# Patient Record
Sex: Female | Born: 1990
Health system: Southern US, Community
[De-identification: ages and names within clinical notes are randomized; demographics above are authoritative.]

## PROBLEM LIST (undated history)

## (undated) ENCOUNTER — Inpatient Hospital Stay (HOSPITAL_COMMUNITY): Payer: Self-pay

## (undated) ENCOUNTER — Emergency Department (HOSPITAL_BASED_OUTPATIENT_CLINIC_OR_DEPARTMENT_OTHER): Admission: EM | Payer: BC Managed Care – PPO | Source: Home / Self Care

## (undated) DIAGNOSIS — D649 Anemia, unspecified: Secondary | ICD-10-CM

## (undated) DIAGNOSIS — R87610 Atypical squamous cells of undetermined significance on cytologic smear of cervix (ASC-US): Secondary | ICD-10-CM

## (undated) DIAGNOSIS — O009 Unspecified ectopic pregnancy without intrauterine pregnancy: Secondary | ICD-10-CM

## (undated) DIAGNOSIS — F99 Mental disorder, not otherwise specified: Secondary | ICD-10-CM

## (undated) DIAGNOSIS — R87629 Unspecified abnormal cytological findings in specimens from vagina: Secondary | ICD-10-CM

## (undated) DIAGNOSIS — N200 Calculus of kidney: Secondary | ICD-10-CM

## (undated) HISTORY — PX: KIDNEY STONE SURGERY: SHX686

## (undated) HISTORY — DX: Mental disorder, not otherwise specified: F99

## (undated) HISTORY — DX: Unspecified abnormal cytological findings in specimens from vagina: R87.629

## (undated) HISTORY — DX: Unspecified ectopic pregnancy without intrauterine pregnancy: O00.90

## (undated) HISTORY — DX: Anemia, unspecified: D64.9

## (undated) HISTORY — DX: Atypical squamous cells of undetermined significance on cytologic smear of cervix (ASC-US): R87.610

---

## 2017-06-19 ENCOUNTER — Other Ambulatory Visit: Payer: Self-pay

## 2017-06-19 ENCOUNTER — Emergency Department (HOSPITAL_BASED_OUTPATIENT_CLINIC_OR_DEPARTMENT_OTHER)
Admission: EM | Admit: 2017-06-19 | Discharge: 2017-06-19 | Disposition: A | Discharge status: Home / Self Care | Payer: 59 | Attending: Emergency Medicine | Admitting: Emergency Medicine

## 2017-06-19 ENCOUNTER — Encounter (HOSPITAL_BASED_OUTPATIENT_CLINIC_OR_DEPARTMENT_OTHER): Payer: Self-pay

## 2017-06-19 DIAGNOSIS — R202 Paresthesia of skin: Secondary | ICD-10-CM | POA: Diagnosis not present

## 2017-06-19 DIAGNOSIS — F419 Anxiety disorder, unspecified: Secondary | ICD-10-CM | POA: Diagnosis not present

## 2017-06-19 HISTORY — DX: Calculus of kidney: N20.0

## 2017-06-19 LAB — BASIC METABOLIC PANEL
Anion gap: 10 (ref 5–15)
BUN: 13 mg/dL (ref 6–20)
CALCIUM: 8.9 mg/dL (ref 8.9–10.3)
CO2: 21 mmol/L — ABNORMAL LOW (ref 22–32)
CREATININE: 0.79 mg/dL (ref 0.44–1.00)
Chloride: 104 mmol/L (ref 101–111)
Glucose, Bld: 121 mg/dL — ABNORMAL HIGH (ref 65–99)
Potassium: 3.7 mmol/L (ref 3.5–5.1)
SODIUM: 135 mmol/L (ref 135–145)

## 2017-06-19 LAB — CBC WITH DIFFERENTIAL/PLATELET
BASOS ABS: 0 10*3/uL (ref 0.0–0.1)
BASOS PCT: 0 %
EOS ABS: 0.1 10*3/uL (ref 0.0–0.7)
EOS PCT: 1 %
HCT: 34.2 % — ABNORMAL LOW (ref 36.0–46.0)
HEMOGLOBIN: 11.2 g/dL — AB (ref 12.0–15.0)
Lymphocytes Relative: 18 %
Lymphs Abs: 2.1 10*3/uL (ref 0.7–4.0)
MCH: 23.1 pg — ABNORMAL LOW (ref 26.0–34.0)
MCHC: 32.7 g/dL (ref 30.0–36.0)
MCV: 70.5 fL — ABNORMAL LOW (ref 78.0–100.0)
Monocytes Absolute: 0.9 10*3/uL (ref 0.1–1.0)
Monocytes Relative: 8 %
NEUTROS PCT: 73 %
Neutro Abs: 8.5 10*3/uL — ABNORMAL HIGH (ref 1.7–7.7)
PLATELETS: 455 10*3/uL — AB (ref 150–400)
RBC: 4.85 MIL/uL (ref 3.87–5.11)
RDW: 18.8 % — ABNORMAL HIGH (ref 11.5–15.5)
WBC: 11.7 10*3/uL — AB (ref 4.0–10.5)

## 2017-06-19 MED ORDER — LORAZEPAM 1 MG PO TABS: 0.5000 mg | ORAL_TABLET | Freq: Once | ORAL | Status: DC

## 2017-06-19 MED ORDER — LORAZEPAM 2 MG/ML IJ SOLN
0.5000 mg | Freq: Once | INTRAMUSCULAR | Status: DC
  Administered 2017-06-19: 0.5 mg via INTRAVENOUS
  Filled 2017-06-19: qty 1

## 2017-06-19 NOTE — ED Notes (Signed)
ED Provider at bedside. 

## 2017-06-19 NOTE — ED Notes (Signed)
Pt verbalizes understanding of d/c instructions and denies any further needs at this time. 

## 2017-06-19 NOTE — ED Triage Notes (Signed)
C/o numbness to left hand started yesterday-denies injury-denies numbness beyond hand-NAD-steady gait

## 2017-06-20 NOTE — ED Provider Notes (Signed)
[facility] EMERGENCY DEPARTMENT Provider Note   CSN: [id number] Arrival date & time: 06/19/17  1819     History   Chief Complaint Chief Complaint  Patient presents with  . Numbness    HPI Joy Duke is a 27 y.o. female.  HPI Patient presents with paresthesias in her left hand.  States it feels numb like when she slept on it.  States she woke up with it this morning.  Thought she had slept on it wrong but that continued to feel numb.  States it does not tingle but feels numb.  States she has had no problems using it.  Think she has anxiety about it.  States she is very nervous about what this could be.  No headache.  No confusion.  No other numbness or weakness.  It is over the whole hand.  Patient tends to get anxious about health problems. Past Medical History:  Diagnosis Date  . Kidney stone     There are no active problems to display for this patient.   Past Surgical History:  Procedure Laterality Date  . KIDNEY STONE SURGERY       OB History   None      Home Medications    Prior to Admission medications   Not on File    Family History No family history on file.  Social History Social History   Tobacco Use  . Smoking status: Never Smoker  . Smokeless tobacco: Never Used  Substance Use Topics  . Alcohol use: Yes    Comment: occ  . Drug use: Never     Allergies   Patient has no known allergies.   Review of Systems Review of Systems  Constitutional: Negative for appetite change.  HENT: Negative for dental problem.   Respiratory: Negative for shortness of breath.   Cardiovascular: Negative for chest pain.  Gastrointestinal: Negative for abdominal pain.  Endocrine: Negative for polyuria.  Genitourinary: Negative for vaginal pain.  Musculoskeletal: Negative for back pain.  Skin: Negative for rash.  Neurological: Positive for numbness. Negative for weakness.  Psychiatric/Behavioral: The patient is nervous/anxious.       Physical Exam Updated Vital Signs BP 140/77   Pulse (!) 116   Temp 99.1 F (37.3 C)   Resp 13   Ht 5\' 2"  (1.575 m)   Wt 90.7 kg (200 lb)   LMP 05/23/2017   SpO2 99%   BMI 36.58 kg/m   Physical Exam  Constitutional: She appears well-developed.  HENT:  Head: Normocephalic.  Eyes: EOM are normal.  Cardiovascular:  Tachycardia  Abdominal: Soft.  Musculoskeletal:  Strong radial pulse.  Neurological: She is alert.  Sensation grossly intact over left hand.  Good sensation radial median ulnar distribution.  Good strength of her radial median and ulnar distribution.  Good flexion extension at the wrist elbow and shoulder.  Skin: Skin is warm.  Psychiatric: She has a normal mood and affect.     ED Treatments / Results  Labs (all labs ordered are listed, but only abnormal results are displayed) Labs Reviewed  BASIC METABOLIC PANEL - Abnormal; Notable for the following components:      Result Value   CO2 21 (*)    Glucose, Bld 121 (*)    All other components within normal limits  CBC WITH DIFFERENTIAL/PLATELET - Abnormal; Notable for the following components:   WBC 11.7 (*)    Hemoglobin 11.2 (*)    HCT 34.2 (*)    MCV 70.5 (*)  MCH 23.1 (*)    RDW 18.8 (*)    Platelets 455 (*)    Neutro Abs 8.5 (*)    All other components within normal limits    EKG EKG Interpretation  Date/Time:  Tuesday Jun 19 2017 18:33:33 EDT Ventricular Rate:  131 PR Interval:  130 QRS Duration: 76 QT Interval:  306 QTC Calculation: 451 R Axis:   89 Text Interpretation:  Sinus tachycardia Nonspecific T wave abnormality Abnormal ECG Confirmed by Davonna Belling [phone]) on 06/19/2017 7:58:07 PM   Radiology No results found.  Procedures Procedures (including critical care time)  Medications Ordered in ED Medications  LORazepam (ATIVAN) tablet 0.5 mg (0.5 mg Oral Not Given 06/19/17 2101)     Initial Impression / Assessment and Plan / ED Course  I have reviewed the triage  vital signs and the nursing notes.  Pertinent labs & imaging results that were available during my care of the patient were reviewed by me and considered in my medical decision making (see chart for details).     Patient with left hand paresthesias.  Benign exam.  Also tachycardia.  This is improved with rest and patient feels as if the hand is gotten better.  The tachycardia improves when she is less anxious.  Final Clinical Impressions(s) / ED Diagnoses   Final diagnoses:  Paresthesias  Anxiety    ED Discharge Orders    None       Davonna Belling, MD 06/20/17 [phone]

## 2017-06-26 ENCOUNTER — Emergency Department (HOSPITAL_BASED_OUTPATIENT_CLINIC_OR_DEPARTMENT_OTHER)
Admission: EM | Admit: 2017-06-26 | Discharge: 2017-06-27 | Disposition: A | Discharge status: Home / Self Care | Payer: 59 | Attending: Emergency Medicine | Admitting: Emergency Medicine

## 2017-06-26 ENCOUNTER — Encounter (HOSPITAL_BASED_OUTPATIENT_CLINIC_OR_DEPARTMENT_OTHER): Payer: Self-pay | Admitting: *Deleted

## 2017-06-26 ENCOUNTER — Other Ambulatory Visit: Payer: Self-pay

## 2017-06-26 DIAGNOSIS — M940 Chondrocostal junction syndrome [Tietze]: Secondary | ICD-10-CM

## 2017-06-26 DIAGNOSIS — M5412 Radiculopathy, cervical region: Secondary | ICD-10-CM | POA: Diagnosis not present

## 2017-06-26 DIAGNOSIS — M542 Cervicalgia: Secondary | ICD-10-CM | POA: Diagnosis present

## 2017-06-26 MED ORDER — METHYLPREDNISOLONE 4 MG PO TBPK: ORAL_TABLET | ORAL | 0 refills | Status: DC

## 2017-06-26 NOTE — ED Notes (Signed)
Pt. Reports having neck pain in May same as she is having now and taking Prednisone dose Pk for this with relief.

## 2017-06-26 NOTE — ED Triage Notes (Signed)
Pain on the left side of her neck for a month. She was seen and diagnosed with muscle spasms. Pain under her left breast for 3 days. Pressing on a certain spot makes it worse.

## 2017-06-26 NOTE — ED Provider Notes (Signed)
[facility] Provider Note: Joy Spurling, MD, FACEP  CSN: [id number] MRN: [id number] ARRIVAL: 06/26/17 at 2125 ROOM: MHFT2/MHFT2   CHIEF COMPLAINT  Neck Pain   HISTORY OF PRESENT ILLNESS  06/26/17 11:33 PM Joy Duke is a 27 y.o. female with about a 109-month history of intermittent pain in her neck.  The pain is in the left side of her neck.  She sometimes has pain radiating to her left elbow as well as paresthesias of the left hand and about the C8 dermatome.  She was seen for paresthesias late last month and was given reassurance.  She had been seen earlier last month at an urgent care for her neck pain and got relief with a steroid taper.  Her symptoms have subsequently returned.  The pain is not severe but is making her anxious.  Pain is worse with movement of the neck.  She is also having some focal rib pain below her left breast.  She states she recently started working out at the gym.  Past Medical History:  Diagnosis Date  . Kidney stone     Past Surgical History:  Procedure Laterality Date  . KIDNEY STONE SURGERY      No family history on file.  Social History   Tobacco Use  . Smoking status: Never Smoker  . Smokeless tobacco: Never Used  Substance Use Topics  . Alcohol use: Yes    Comment: occ  . Drug use: Never    Prior to Admission medications   Not on File    Allergies Patient has no known allergies.   REVIEW OF SYSTEMS  Negative except as noted here or in the History of Present Illness.   PHYSICAL EXAMINATION  Initial Vital Signs Blood pressure (!) 158/87, pulse (!) 105, temperature 98.3 F (36.8 C), temperature source Oral, resp. rate 16, height 5\' 2"  (1.575 m), weight 90.7 kg (200 lb), last menstrual period 06/19/2017, SpO2 100 %.  Examination General: Well-developed, well-nourished female in no acute distress; appearance consistent with age of record HENT: normocephalic; atraumatic Eyes: pupils equal, round and reactive to  light; extraocular muscles intact Neck: supple; mild tenderness left posterolateral neck; movement of the neck reproduces pain Heart: regular rate and rhythm Lungs: clear to auscultation bilaterally Chest: Well localized left paraspinal rib tenderness at about the left inframammary fold Abdomen: soft; nondistended Extremities: No deformity; full range of motion Neurologic: Awake, alert and oriented; motor function intact in all extremities and symmetric; no facial droop Skin: Warm and dry Psychiatric: Normal mood and affect   RESULTS  Summary of this visit's results, reviewed by myself:   EKG Interpretation  Date/Time:    Ventricular Rate:    PR Interval:    QRS Duration:   QT Interval:    QTC Calculation:   R Axis:     Text Interpretation:        Laboratory Studies: No results found for this or any previous visit (from the past 24 hour(s)). Imaging Studies: No results found.  ED COURSE and MDM  Nursing notes and initial vitals signs, including pulse oximetry, reviewed.  Vitals:   06/26/17 2130 06/26/17 2131  BP:  (!) 158/87  Pulse:  (!) 105  Resp:  16  Temp:  98.3 F (36.8 C)  TempSrc:  Oral  SpO2:  100%  Weight: 90.7 kg (200 lb)   Height: 5\' 2"  (1.575 m)    Neck pain and paresthesias consistent with a C8 cervical radiculopathy.  Chest wall pain consistent  with acute costochondritis.  Since she had significant relief with a steroid taper she is requesting the same and we will provide this.  We will refer her to sports medicine for further evaluation and treatment.  PROCEDURES    ED DIAGNOSES     ICD-10-CM   1. Cervical radiculopathy at C8 M54.12   2. Costochondritis M94.0        Colbe Viviano, MD 06/26/17 2348

## 2017-08-03 ENCOUNTER — Encounter: Payer: Self-pay | Admitting: Osteopathic Medicine

## 2017-08-03 ENCOUNTER — Ambulatory Visit (INDEPENDENT_AMBULATORY_CARE_PROVIDER_SITE_OTHER): Payer: 59 | Admitting: Osteopathic Medicine

## 2017-08-03 VITALS — BP 128/72 | HR 86 | Temp 98.7°F | Ht 63.5 in | Wt 196.0 lb

## 2017-08-03 DIAGNOSIS — R21 Rash and other nonspecific skin eruption: Secondary | ICD-10-CM | POA: Diagnosis not present

## 2017-08-03 DIAGNOSIS — R002 Palpitations: Secondary | ICD-10-CM

## 2017-08-03 DIAGNOSIS — F419 Anxiety disorder, unspecified: Secondary | ICD-10-CM

## 2017-08-03 DIAGNOSIS — R7309 Other abnormal glucose: Secondary | ICD-10-CM | POA: Insufficient documentation

## 2017-08-03 DIAGNOSIS — R7303 Prediabetes: Secondary | ICD-10-CM | POA: Insufficient documentation

## 2017-08-03 DIAGNOSIS — D509 Iron deficiency anemia, unspecified: Secondary | ICD-10-CM

## 2017-08-03 NOTE — Patient Instructions (Addendum)
Plan:  Palpitations/anxiety  Will get Holter monitor took at heart rhythm for 48 hours  Will get echocardiogram to evaluate heart function/structure  Will get labs today   Consider medications +/- cardiology referral based on results   Anemia  Will recheck labs  Likely cause is iron deficiency or thalassemia   Rash   On breast: I think it's just a small infection of hair follicle, something these can be severe or form boils - come see me if that happens  On leg: can try treating with over-the-counter antifungal (clotrimazole cream/ointment) and see if this helps - mild itchy fungal infections can be difficult to detect on dark skin

## 2017-08-03 NOTE — Progress Notes (Signed)
HPI: Joy Duke is a 27 y.o. female who  has a past medical history of Kidney stone.  she presents to [facility] today, 08/03/17,  for chief complaint of: New to Establish Depression/Anxiety   Palpitations  Palpitations few times per day, comes w/ anxiety but not always every time feeling anxious, notes fast HR without SOB/dizziness. ER visit, EKG showed sinus tach. BMP and CBC ok.   Seeing a therapist for anxiety, which is helping. Would like to avoid medications but had a few questions about medicines.   Rash on breast: on L, no lump, was draning a bit but now healed, would like it looked at.   Other skin: Itching spot on R thigh on occasion, no rash.    Depression screen PHQ 2/9 08/03/2017  Decreased Interest 2  Down, Depressed, Hopeless 1  PHQ - 2 Score 3  Altered sleeping 2  Tired, decreased energy 1  Change in appetite 1  Feeling bad or failure about yourself  1  Trouble concentrating 2  Moving slowly or fidgety/restless 0  Suicidal thoughts 0  PHQ-9 Score 10  Difficult doing work/chores Somewhat difficult   GAD 7 : Generalized Anxiety Score 08/03/2017  Nervous, Anxious, on Edge 2  Control/stop worrying 2  Worry too much - different things 2  Trouble relaxing 1  Restless 1  Easily annoyed or irritable 1  Afraid - awful might happen 1  Total GAD 7 Score 10  Anxiety Difficulty Somewhat difficult      Past medical, surgical, social and family history reviewed:  Patient Active Problem List   Diagnosis Date Noted  . Palpitations 08/03/2017  . Rash and nonspecific skin eruption 08/03/2017  . Microcytic anemia 08/03/2017  . Anxiety 08/03/2017  . Elevated glucose level 08/03/2017   Past Surgical History:  Procedure Laterality Date  . KIDNEY STONE SURGERY     Social History   Tobacco Use  . Smoking status: Never Smoker  . Smokeless tobacco: Never Used  Substance Use Topics  . Alcohol use: Yes    Comment: occ    Family History  Problem Relation Age of Onset  . Diabetes Mother   . Hypertension Mother   . Breast cancer Maternal Grandmother      Current medication list and allergy/intolerance information reviewed:    No current outpatient medications on file.   No current facility-administered medications for this visit.     No Known Allergies    Review of Systems:  Constitutional:  No  fever, no chills, No recent illness, No unintentional weight changes. No significant fatigue.   HEENT: No  headache, no vision change, no hearing change, No sore throat, No  sinus pressure  Cardiac: No  chest pain, No  pressure, +palpitations, No  Orthopnea  Respiratory:  No  shortness of breath. No  Cough  Gastrointestinal: No  abdominal pain, No  nausea, No  vomiting,  No  blood in stool, No  diarrhea, No  constipation   Musculoskeletal: No new myalgia/arthralgia  Skin: +Rash, No other wounds/concerning lesions  Genitourinary: No  incontinence, No  abnormal genital bleeding, No abnormal genital discharge  Hem/Onc: No  easy bruising/bleeding, No  abnormal lymph node  Endocrine: No cold intolerance,  No heat intolerance. No polyuria/polydipsia/polyphagia   Neurologic: No  weakness, No  dizziness, No  slurred speech/focal weakness/facial droop  Psychiatric: +concerns with depression, +concerns with anxiety, No sleep problems, No mood problems  Exam:  BP 128/72   Pulse 86  Temp 98.7 F (37.1 C) (Oral)   Ht 5' 3.5" (1.613 m)   Wt 196 lb (88.9 kg)   BMI 34.18 kg/m   Constitutional: VS see above. General Appearance: alert, well-developed, well-nourished, NAD  Eyes: Normal lids and conjunctive, non-icteric sclera  Ears, Nose, Mouth, Throat: MMM, Normal external inspection ears/nares/mouth/lips/gums. TM normal bilaterally. Pharynx/tonsils no erythema, no exudate. Nasal mucosa normal.   Neck: No masses, trachea midline. No thyroid enlargement. No tenderness/mass appreciated. No  lymphadenopathy  Respiratory: Normal respiratory effort. no wheeze, no rhonchi, no rales  Cardiovascular: S1/S2 normal, no murmur, no rub/gallop auscultated. RRR. No lower extremity edema. Pedal pulse II/IV bilaterally DP and PT. No carotid bruit or JVD. No abdominal aortic bruit.  Gastrointestinal: Nontender, no masses. No hepatomegaly, no splenomegaly. No hernia appreciated. Bowel sounds normal. Rectal exam deferred.   Musculoskeletal: Gait normal. No clubbing/cyanosis of digits.   Neurological: Normal balance/coordination. No tremor. No cranial nerve deficit on limited exam. Motor and sensation intact and symmetric. Cerebellar reflexes intact.   Skin: warm, dry, intact. No rash/ulcer. No concerning nevi or subq nodules on limited exam.    Psychiatric: Normal judgment/insight. Normal mood and affect. Oriented x3.   BREAST: No skin changes, healing looks likely old folliculitis or mild abscess on L breast, R breast normal skin, size limits full exam but palpable feels all normal fibrous breast tissue, no masses or tenderness, normal nipple without discharge, normal axilla       ASSESSMENT/PLAN:    Palpitations - Plan: CBC, COMPLETE METABOLIC PANEL WITH GFR, Lipid panel, TSH, Urinalysis, Routine w reflex microscopic, Holter monitor - 48 hour, ECHOCARDIOGRAM COMPLETE  Rash and nonspecific skin eruption  Microcytic anemia - Plan: Fe+TIBC+Fer, Hemoglobinopathy evaluation  Anxiety - discussed med options, we opted to defer for now and will consider based on cardiac workup and other test resutls, symptoms, d/w therapist   Elevated glucose level - Plan: Hemoglobin A1c    Patient Instructions  Plan:  Palpitations/anxiety  Will get Holter monitor took at heart rhythm for 48 hours  Will get echocardiogram to evaluate heart function/structure  Will get labs today   Consider medications +/- cardiology referral based on results   Anemia  Will recheck labs  Likely cause is  iron deficiency or thalassemia   Rash   On breast: I think it's just a small infection of hair follicle, something these can be severe or form boils - come see me if that happens  On leg: can try treating with over-the-counter antifungal (clotrimazole cream/ointment) and see if this helps - mild itchy fungal infections can be difficult to detect on dark skin      Visit summary with medication list and pertinent instructions was printed for patient to review. All questions at time of visit were answered - patient instructed to contact office with any additional concerns. ER/RTC precautions were reviewed with the patient.   Follow-up plan: Return for 2-3 weeks, recheck palpitations/anxiety, see [place] sooner if needed .    Please note: voice recognition software was used to produce this document, and typos may escape review. Please contact Dr. Sheppard Coil for any needed clarifications.

## 2017-08-04 LAB — IRON,TIBC AND FERRITIN PANEL
%SAT: 7 % (calc) — ABNORMAL LOW (ref 16–45)
Ferritin: 10 ng/mL — ABNORMAL LOW (ref 16–154)
Iron: 28 ug/dL — ABNORMAL LOW (ref 40–190)
TIBC: 403 mcg/dL (calc) (ref 250–450)

## 2017-08-04 LAB — COMPLETE METABOLIC PANEL WITH GFR
AG Ratio: 1.2 (calc) (ref 1.0–2.5)
ALBUMIN MSPROF: 4.3 g/dL (ref 3.6–5.1)
ALKALINE PHOSPHATASE (APISO): 90 U/L (ref 33–115)
ALT: 12 U/L (ref 6–29)
AST: 13 U/L (ref 10–30)
BILIRUBIN TOTAL: 0.5 mg/dL (ref 0.2–1.2)
BUN: 9 mg/dL (ref 7–25)
CHLORIDE: 103 mmol/L (ref 98–110)
CO2: 23 mmol/L (ref 20–32)
CREATININE: 0.72 mg/dL (ref 0.50–1.10)
Calcium: 9.3 mg/dL (ref 8.6–10.2)
GFR, Est African American: 134 mL/min/{1.73_m2} (ref >=60)
GFR, Est Non African American: 116 mL/min/{1.73_m2} (ref >=60)
GLOBULIN: 3.7 g/dL (ref 1.9–3.7)
Glucose, Bld: 101 mg/dL — ABNORMAL HIGH (ref 65–99)
POTASSIUM: 4.6 mmol/L (ref 3.5–5.3)
Sodium: 136 mmol/L (ref 135–146)
Total Protein: 8 g/dL (ref 6.1–8.1)

## 2017-08-04 LAB — URINALYSIS, ROUTINE W REFLEX MICROSCOPIC
BILIRUBIN URINE: NEGATIVE
Bacteria, UA: NONE SEEN /HPF
Glucose, UA: NEGATIVE
Hgb urine dipstick: NEGATIVE
Hyaline Cast: NONE SEEN /LPF
Ketones, ur: NEGATIVE
Leukocytes, UA: NEGATIVE
NITRITE: NEGATIVE
PROTEIN: NEGATIVE
RBC / HPF: NONE SEEN /HPF (ref 0–2)
Specific Gravity, Urine: 1.028 (ref 1.001–1.03)
WBC, UA: NONE SEEN /HPF (ref 0–5)
pH: 6.5 (ref 5.0–8.0)

## 2017-08-04 LAB — LIPID PANEL
CHOL/HDL RATIO: 3.2 (calc) (ref <5.0)
CHOLESTEROL: 159 mg/dL (ref <200)
HDL: 49 mg/dL — ABNORMAL LOW (ref >50)
LDL Cholesterol (Calc): 92 mg/dL (calc)
Non-HDL Cholesterol (Calc): 110 mg/dL (calc) (ref <130)
Triglycerides: 89 mg/dL (ref <150)

## 2017-08-04 LAB — CBC
HEMATOCRIT: 35.6 % (ref 35.0–45.0)
HEMOGLOBIN: 10.7 g/dL — AB (ref 11.7–15.5)
MCH: 21.6 pg — AB (ref 27.0–33.0)
MCHC: 30.1 g/dL — AB (ref 32.0–36.0)
MCV: 71.8 fL — ABNORMAL LOW (ref 80.0–100.0)
MPV: 9.8 fL (ref 7.5–12.5)
Platelets: 504 10*3/uL — ABNORMAL HIGH (ref 140–400)
RBC: 4.96 10*6/uL (ref 3.80–5.10)
RDW: 16.7 % — ABNORMAL HIGH (ref 11.0–15.0)
WBC: 10.5 10*3/uL (ref 3.8–10.8)

## 2017-08-04 LAB — HEMOGLOBIN A1C
Hgb A1c MFr Bld: 6.1 % of total Hgb — ABNORMAL HIGH (ref <5.7)
Mean Plasma Glucose: 128 (calc)
eAG (mmol/L): 7.1 (calc)

## 2017-08-04 LAB — TSH: TSH: 0.51 mIU/L

## 2017-08-08 MED ORDER — FERROUS SULFATE 325 (65 FE) MG PO TBEC: 325.0000 mg | DELAYED_RELEASE_TABLET | Freq: Every day | ORAL | 1 refills | Status: DC

## 2017-08-08 NOTE — Addendum Note (Signed)
Addended by: Maryla Morrow on: 08/08/2017 05:37 PM   Modules accepted: Orders

## 2017-08-22 ENCOUNTER — Other Ambulatory Visit (HOSPITAL_BASED_OUTPATIENT_CLINIC_OR_DEPARTMENT_OTHER): Payer: 59

## 2017-08-24 ENCOUNTER — Ambulatory Visit (HOSPITAL_BASED_OUTPATIENT_CLINIC_OR_DEPARTMENT_OTHER)
Admission: RE | Admit: 2017-08-24 | Discharge: 2017-08-24 | Disposition: A | Discharge status: Home / Self Care | Payer: 59 | Source: Ambulatory Visit | Attending: Osteopathic Medicine | Admitting: Osteopathic Medicine

## 2017-08-24 DIAGNOSIS — R002 Palpitations: Secondary | ICD-10-CM

## 2017-08-24 NOTE — Progress Notes (Signed)
  Echocardiogram 2D Echocardiogram has been performed.  Daric Koren T Wojciech Willetts RDCS, RVT 08/24/2017, 8:50 AM

## 2017-08-27 ENCOUNTER — Ambulatory Visit: Payer: 59

## 2017-08-27 DIAGNOSIS — R002 Palpitations: Secondary | ICD-10-CM

## 2017-09-11 NOTE — Progress Notes (Signed)
Holter monitor shows normal heart rhythm Average heart rate is on the high end of normal Option to discuss w/PCP treating palpitations with low-dose beta blocker

## 2017-11-08 ENCOUNTER — Other Ambulatory Visit (HOSPITAL_COMMUNITY)
Admission: RE | Admit: 2017-11-08 | Discharge: 2017-11-08 | Disposition: A | Discharge status: Home / Self Care | Payer: 59 | Source: Ambulatory Visit | Attending: Osteopathic Medicine | Admitting: Osteopathic Medicine

## 2017-11-08 ENCOUNTER — Ambulatory Visit (INDEPENDENT_AMBULATORY_CARE_PROVIDER_SITE_OTHER): Payer: 59 | Admitting: Osteopathic Medicine

## 2017-11-08 ENCOUNTER — Encounter: Payer: Self-pay | Admitting: Osteopathic Medicine

## 2017-11-08 VITALS — BP 138/92 | HR 108 | Temp 98.8°F | Wt 201.5 lb

## 2017-11-08 DIAGNOSIS — R7309 Other abnormal glucose: Secondary | ICD-10-CM

## 2017-11-08 DIAGNOSIS — Z01419 Encounter for gynecological examination (general) (routine) without abnormal findings: Secondary | ICD-10-CM | POA: Diagnosis present

## 2017-11-08 DIAGNOSIS — Z124 Encounter for screening for malignant neoplasm of cervix: Secondary | ICD-10-CM | POA: Diagnosis not present

## 2017-11-08 DIAGNOSIS — D509 Iron deficiency anemia, unspecified: Secondary | ICD-10-CM

## 2017-11-08 DIAGNOSIS — N939 Abnormal uterine and vaginal bleeding, unspecified: Secondary | ICD-10-CM

## 2017-11-08 DIAGNOSIS — N63 Unspecified lump in unspecified breast: Secondary | ICD-10-CM

## 2017-11-08 NOTE — Progress Notes (Signed)
HPI: Joy Duke is a 27 y.o. female who  has a past medical history of Kidney stone.  she presents to [facility] today, 11/08/17,  for chief complaint of: Well-Woman Exam w/ Pap  Patient here for annual physical / wellness exam.  See preventive care reviewed as below.   Additional concerns today include:  Need to follow-up on A1C, prediabetes range 3 mos ago at 6.1%    Past medical, surgical, social and family history reviewed:  Patient Active Problem List   Diagnosis Date Noted  . Palpitations 08/03/2017  . Rash and nonspecific skin eruption 08/03/2017  . Microcytic anemia 08/03/2017  . Anxiety 08/03/2017  . Elevated glucose level 08/03/2017    Past Surgical History:  Procedure Laterality Date  . KIDNEY STONE SURGERY      Social History   Tobacco Use  . Smoking status: Never Smoker  . Smokeless tobacco: Never Used  Substance Use Topics  . Alcohol use: Yes    Comment: occ    Family History  Problem Relation Age of Onset  . Diabetes Mother   . Hypertension Mother   . Breast cancer Maternal Grandmother      Current medication list and allergy/intolerance information reviewed:    Current Outpatient Medications  Medication Sig Dispense Refill  . ferrous sulfate 325 (65 FE) MG EC tablet Take 1 tablet (325 mg total) by mouth daily with breakfast. 90 tablet 1   No current facility-administered medications for this visit.     No Known Allergies    Review of Systems:  Constitutional:  No  fever, no chills, No recent illness, No unintentional weight changes. No significant fatigue.   HEENT: No  headache, no vision change  Cardiac: No  chest pain, No  pressure, No palpitations (have gotten much less frequent), No  Orthopnea  Respiratory:  No  shortness of breath. No  Cough  Gastrointestinal: No  abdominal pain, No  nausea  Musculoskeletal: No new myalgia/arthralgia  Skin: No  Rash, No other wounds/concerning  lesions  Genitourinary: No  incontinence, No  abnormal genital bleeding, No abnormal genital discharge  Hem/Onc: No  easy bruising/bleeding, No  abnormal lymph node  Endocrine: No cold intolerance,  No heat intolerance.  Neurologic: No  weakness, No  dizziness,  Psychiatric: No  concerns with depression, No  concerns with anxiety, No sleep problems, No mood problems  Exam:  BP (!) 148/80 (BP Location: Left Arm, Patient Position: Sitting, Cuff Size: Normal)   Pulse (!) 125   Temp 98.8 F (37.1 C) (Oral)   Wt 201 lb 8 oz (91.4 kg)   BMI 35.13 kg/m   Constitutional: VS see above. General Appearance: alert, well-developed, well-nourished, NAD  Eyes: Normal lids and conjunctive, non-icteric sclera  Ears, Nose, Mouth, Throat: MMM, Normal external inspection ears/nares/mouth/lips/gums. TM normal bilaterally. Pharynx/tonsils no erythema, no exudate. Nasal mucosa normal.   Neck: No masses, trachea midline. No thyroid enlargement. No tenderness/mass appreciated. No lymphadenopathy  Respiratory: Normal respiratory effort. no wheeze, no rhonchi, no rales  Cardiovascular: S1/S2 normal, no murmur, no rub/gallop auscultated. RRR. No lower extremity edema.   Gastrointestinal: Nontender, no masses. No hepatomegaly, no splenomegaly. No hernia appreciated. Bowel sounds normal. Rectal exam deferred.   Musculoskeletal: Gait normal. No clubbing/cyanosis of digits.   Neurological: Normal balance/coordination. No tremor. No cranial nerve deficit on limited exam. Motor and sensation intact and symmetric. Cerebellar reflexes intact.   Skin: warm, dry, intact. No rash/ulcer. No concerning nevi or subq  nodules on limited exam.    Psychiatric: Normal judgment/insight. Normal mood and affect. Oriented x3.  GYN: No lesions/ulcers to external genitalia, normal urethra, normal vaginal mucosa, physiologic discharge, cervix normal without lesions, uterus not enlarged or tender, adnexa no masses and  nontender  BREAST: No rashes/skin changes, normal fibrous breast tissue, no  Tenderness, small firm cystic mass R breast 3-4:00 normal nipple without discharge, normal axilla          ASSESSMENT/PLAN:   Well woman exam - Plan: Cytology - PAP, CBC, Hemoglobin A1c, Fe+TIBC+Fer  Cervical cancer screening - Plan: Cytology - PAP, CBC, Hemoglobin A1c, Fe+TIBC+Fer  Microcytic anemia - Plan: CBC, Hemoglobin A1c, Fe+TIBC+Fer  Elevated glucose level - Plan: CBC, Hemoglobin A1c, Fe+TIBC+Fer  Breast lump in female - Plan: US BREAST LTD UNI RIGHT INC AXILLA  Abnormal uterine bleeding - Plan: US PELVIS (TRANSABDOMINAL ONLY), US Transvaginal Non-OB    Patient Instructions  General Preventive Care  Most recent routine screening lipids/other labs: ordered today to follow-up. Cholesterol and Diabetes screening usually recommended annually.   Everyone should have blood pressure checked once per year.   Tobacco: don't! Alcohol: responsible moderation is ok for most adults - if you have concerns about your alcohol intake, please talk to me! Recreational/Illicit Drugs: don't!  Exercise: as tolerated to reduce risk of cardiovascular disease and diabetes. Strength training will also prevent osteoporosis.   Mental health: if need for mental health care (medicines, counseling, other), or concerns about moods, please let me know!   Sexual health: if need for STD testing, or if concerns with libido/pain problems, please let me know!  Vaccines  Flu vaccine: recommended for almost everyone, every fall (by Halloween! Flu is scary!), especially if you are pregnant, if you have exposure to the public, if you're around young children or the elderly, or if you're around pregnant people.   HPV vaccine: Gardasil protetcs against HPV infection which is the most common cause of cervical cancer. I recommend this vaccine for all kids and young adults.   Tetanus booster: Tdap recommended every 10 years Cancer  screenings   Colon cancer screening: recommended for everyone at age 19, but some folks need a colonoscopy sooner if risk factors   Breast cancer screening: mammogram recommended at age 33 every other year, but some folks need screening sooner depending on family history   Cervical cancer screening: Pap smear every 1 to 5 years depending on age and other risk factors. Can stop at age 32 or w/ hysterectomy as long as previous testing was normal.  Infection screenings . HIV: recommended screening at least once age 62-65, repeating as needed . Gonorrhea/Chlamydia: screening as needed, though many insurances require testing for anyone on birth control pills . TB: certain at-risk populations, or depending on work requirements and/or travel history             Visit summary with medication list and pertinent instructions was printed for patient to review. All questions at time of visit were answered - patient instructed to contact office with any additional concerns. ER/RTC precautions were reviewed with the patient.   Follow-up plan: Return for recheck depending on labs /results .    Please note: voice recognition software was used to produce this document, and typos may escape review. Please contact Dr. Sheppard Coil for any needed clarifications.

## 2017-11-08 NOTE — Patient Instructions (Addendum)
General Preventive Care  Most recent routine screening lipids/other labs: ordered today to follow-up. Cholesterol and Diabetes screening usually recommended annually.   Everyone should have blood pressure checked once per year.   Tobacco: don't! Alcohol: responsible moderation is ok for most adults - if you have concerns about your alcohol intake, please talk to me! Recreational/Illicit Drugs: don't!  Exercise: as tolerated to reduce risk of cardiovascular disease and diabetes. Strength training will also prevent osteoporosis.   Mental health: if need for mental health care (medicines, counseling, other), or concerns about moods, please let me know!   Sexual health: if need for STD testing, or if concerns with libido/pain problems, please let me know!  Vaccines  Flu vaccine: recommended for almost everyone, every fall (by Halloween! Flu is scary!), especially if you are pregnant, if you have exposure to the public, if you're around young children or the elderly, or if you're around pregnant people.   HPV vaccine: Gardasil protetcs against HPV infection which is the most common cause of cervical cancer. I recommend this vaccine for all kids and young adults.   Tetanus booster: Tdap recommended every 10 years Cancer screenings   Colon cancer screening: recommended for everyone at age 24, but some folks need a colonoscopy sooner if risk factors   Breast cancer screening: mammogram recommended at age 34 every other year, but some folks need screening sooner depending on family history   Cervical cancer screening: Pap smear every 1 to 5 years depending on age and other risk factors. Can stop at age 82 or w/ hysterectomy as long as previous testing was normal.  Infection screenings . HIV: recommended screening at least once age 23-65, repeating as needed . Gonorrhea/Chlamydia: screening as needed, though many insurances require testing for anyone on birth control pills . TB: certain at-risk  populations, or depending on work requirements and/or travel history

## 2017-11-09 LAB — CBC
HCT: 35.6 % (ref 35.0–45.0)
Hemoglobin: 11.4 g/dL — ABNORMAL LOW (ref 11.7–15.5)
MCH: 23.7 pg — ABNORMAL LOW (ref 27.0–33.0)
MCHC: 32 g/dL (ref 32.0–36.0)
MCV: 73.9 fL — AB (ref 80.0–100.0)
MPV: 10.3 fL (ref 7.5–12.5)
PLATELETS: 453 10*3/uL — AB (ref 140–400)
RBC: 4.82 10*6/uL (ref 3.80–5.10)
RDW: 17.2 % — ABNORMAL HIGH (ref 11.0–15.0)
WBC: 10.2 10*3/uL (ref 3.8–10.8)

## 2017-11-09 LAB — HEMOGLOBIN A1C
EAG (MMOL/L): 7 (calc)
Hgb A1c MFr Bld: 6 % of total Hgb — ABNORMAL HIGH (ref <5.7)
MEAN PLASMA GLUCOSE: 126 (calc)

## 2017-11-09 LAB — IRON,TIBC AND FERRITIN PANEL
%SAT: 7 % — AB (ref 16–45)
Ferritin: 20 ng/mL (ref 16–154)
IRON: 25 ug/dL — AB (ref 40–190)
TIBC: 369 mcg/dL (calc) (ref 250–450)

## 2017-11-14 ENCOUNTER — Encounter: Payer: Self-pay | Admitting: Osteopathic Medicine

## 2017-11-14 DIAGNOSIS — R87610 Atypical squamous cells of undetermined significance on cytologic smear of cervix (ASC-US): Secondary | ICD-10-CM | POA: Insufficient documentation

## 2017-11-14 HISTORY — DX: Atypical squamous cells of undetermined significance on cytologic smear of cervix (ASC-US): R87.610

## 2017-11-14 LAB — CYTOLOGY - PAP
DIAGNOSIS: UNDETERMINED — AB
HPV: NOT DETECTED

## 2017-11-21 ENCOUNTER — Other Ambulatory Visit: Payer: 59

## 2017-11-21 ENCOUNTER — Ambulatory Visit
Admission: RE | Admit: 2017-11-21 | Discharge: 2017-11-21 | Disposition: A | Discharge status: Home / Self Care | Payer: 59 | Source: Ambulatory Visit | Attending: Osteopathic Medicine | Admitting: Osteopathic Medicine

## 2017-11-21 DIAGNOSIS — N63 Unspecified lump in unspecified breast: Secondary | ICD-10-CM

## 2018-09-12 ENCOUNTER — Telehealth: Payer: Self-pay | Admitting: Osteopathic Medicine

## 2018-09-12 NOTE — Telephone Encounter (Signed)
Received paperwork from short-term disability for this patient, I have not seen her since October 2019 and have no idea what this disability form is about.  Can we schedule a virtual visit to discuss, or if another physician has been treating what ever she is claiming disability for, they are responsible for this paperwork.

## 2018-09-13 NOTE — Telephone Encounter (Signed)
Patient scheduled.

## 2018-09-17 ENCOUNTER — Ambulatory Visit: Payer: 59 | Admitting: Osteopathic Medicine

## 2018-11-05 ENCOUNTER — Encounter: Payer: Self-pay | Admitting: Osteopathic Medicine

## 2018-11-05 ENCOUNTER — Other Ambulatory Visit: Payer: Self-pay

## 2018-11-05 ENCOUNTER — Ambulatory Visit (INDEPENDENT_AMBULATORY_CARE_PROVIDER_SITE_OTHER): Payer: BC Managed Care – PPO | Admitting: Osteopathic Medicine

## 2018-11-05 VITALS — BP 129/88 | HR 112 | Temp 98.9°F | Wt 202.0 lb

## 2018-11-05 DIAGNOSIS — R635 Abnormal weight gain: Secondary | ICD-10-CM | POA: Diagnosis not present

## 2018-11-05 DIAGNOSIS — D509 Iron deficiency anemia, unspecified: Secondary | ICD-10-CM

## 2018-11-05 DIAGNOSIS — Z0000 Encounter for general adult medical examination without abnormal findings: Secondary | ICD-10-CM

## 2018-11-05 DIAGNOSIS — R7309 Other abnormal glucose: Secondary | ICD-10-CM

## 2018-11-05 DIAGNOSIS — R599 Enlarged lymph nodes, unspecified: Secondary | ICD-10-CM | POA: Diagnosis not present

## 2018-11-05 NOTE — Progress Notes (Signed)
HPI: Joy Duke is a 28 y.o. female who  has a past medical history of ASCUS of cervix with negative high risk HPV (11/14/2017) and Kidney stone.  she presents to [facility] today, 11/05/18,  for chief complaint of:  Knots on neck  . Context: no injury or illness in past 3 mos  . Location/Quality: 2 small knots behind the neck, not really painful just wants to get looked at  . Duration: second one came up 2 weeks ago, first one was there about  A month and a half ago  . Timing: constant  . Assoc signs/symptoms: NO fever/night sweats, weight loss, neck pain   At today's visit 11/05/18 ... PMH, PSH, FH reviewed and updated as needed.  Current medication list and allergy/intolerance hx reviewed and updated as needed. (See remainder of HPI, ROS, Phys Exam below)   No results found.  No results found for this or any previous visit (from the past 72 hour(s)).        ASSESSMENT/PLAN: The primary encounter diagnosis was Palpable lymph node. Diagnoses of Annual physical exam, Microcytic anemia, Elevated glucose level, and Weight gain were also pertinent to this visit.   Normal palpable lymph node - reassurance provided, monitor  Labs ordered for future visit. Annual physical / preventive care was NOT performed or billed today.   Needs Pap soon!   Orders Placed This Encounter  Procedures  . COMPLETE METABOLIC PANEL WITH GFR  . Lipid panel  . TSH  . Hemoglobin A1c  . Fe+TIBC+Fer  . CBC with Differential/Platelet         Follow-up plan: Return for ANNUAL (get labs prior to visit, orders are in) sometime next couple months .                                                 ################################################# ################################################# ################################################# #################################################    No  outpatient medications have been marked as taking for the 11/05/18 encounter (Appointment) with Emeterio Reeve, DO.    No Known Allergies     Review of Systems:  Constitutional: No recent illness, no fever/chills, no night sweats, no weight loss   HEENT: No  headache, no vision change  Cardiac: No  chest pain, No  pressure, No palpitations  Respiratory:  No  shortness of breath. No  Cough  Musculoskeletal: No new myalgia/arthralgia  Skin: No  Rash  Hem/Onc: No  easy bruising/bleeding, +abnormal lumps/bumps  Neurologic: No  weakness, No  Dizziness  Psychiatric: No  concerns with depression, No  concerns with anxiety  Exam:  BP 129/88 (BP Location: Left Arm, Patient Position: Sitting, Cuff Size: Large)   Pulse (!) 112   Temp 98.9 F (37.2 C) (Oral)   Wt 202 lb 0.6 oz (91.6 kg)   BMI 35.23 kg/m   Constitutional: VS see above. General Appearance: alert, well-developed, well-nourished, NAD  Neck: No masses, trachea midline. Normal posterior cervical lymph nodes barely palpable at hairline bilaterally   Respiratory: Normal respiratory effort.   Musculoskeletal: Gait normal. Symmetric and independent movement of all extremities  Abdominal: non-tender, non-distended, no appreciable organomegaly, neg Murphy's, BS [id number]  Neurological: Normal balance/coordination. No tremor.  Skin: warm, dry, intact.   Psychiatric: Normal judgment/insight. Normal mood and affect. Oriented x3.       Visit summary with medication list and pertinent  instructions was printed for patient to review, patient was advised to alert [place] if any updates are needed. All questions at time of visit were answered - patient instructed to contact office with any additional concerns. ER/RTC precautions were reviewed with the patient and understanding verbalized.      Please note: voice recognition software was used to produce this document, and typos may escape review. Please contact Dr. Sheppard Coil  for any needed clarifications.    Follow up plan: Return for ANNUAL (get labs prior to visit, orders are in) sometime next couple months .

## 2018-11-06 ENCOUNTER — Other Ambulatory Visit: Payer: Self-pay

## 2018-11-06 DIAGNOSIS — D509 Iron deficiency anemia, unspecified: Secondary | ICD-10-CM

## 2018-11-06 LAB — COMPLETE METABOLIC PANEL WITH GFR
AG Ratio: 1.1 (calc) (ref 1.0–2.5)
ALT: 12 U/L (ref 6–29)
AST: 17 U/L (ref 10–30)
Albumin: 3.9 g/dL (ref 3.6–5.1)
Alkaline phosphatase (APISO): 76 U/L (ref 31–125)
BUN: 8 mg/dL (ref 7–25)
CO2: 25 mmol/L (ref 20–32)
Calcium: 8.9 mg/dL (ref 8.6–10.2)
Chloride: 105 mmol/L (ref 98–110)
Creat: 0.75 mg/dL (ref 0.50–1.10)
GFR, Est African American: 127 mL/min/{1.73_m2} (ref >=60)
GFR, Est Non African American: 109 mL/min/{1.73_m2} (ref >=60)
Globulin: 3.7 g/dL (calc) (ref 1.9–3.7)
Glucose, Bld: 117 mg/dL — ABNORMAL HIGH (ref 65–99)
Potassium: 4 mmol/L (ref 3.5–5.3)
Sodium: 136 mmol/L (ref 135–146)
Total Bilirubin: 0.4 mg/dL (ref 0.2–1.2)
Total Protein: 7.6 g/dL (ref 6.1–8.1)

## 2018-11-06 LAB — CBC WITH DIFFERENTIAL/PLATELET
Absolute Monocytes: 481 cells/uL (ref 200–950)
Basophils Absolute: 52 cells/uL (ref 0–200)
Basophils Relative: 0.7 %
Eosinophils Absolute: 148 cells/uL (ref 15–500)
Eosinophils Relative: 2 %
HCT: 36.1 % (ref 35.0–45.0)
Hemoglobin: 10.7 g/dL — ABNORMAL LOW (ref 11.7–15.5)
Lymphs Abs: 2220 cells/uL (ref 850–3900)
MCH: 21.7 pg — ABNORMAL LOW (ref 27.0–33.0)
MCHC: 29.6 g/dL — ABNORMAL LOW (ref 32.0–36.0)
MCV: 73.1 fL — ABNORMAL LOW (ref 80.0–100.0)
MPV: 10 fL (ref 7.5–12.5)
Monocytes Relative: 6.5 %
Neutro Abs: 4499 cells/uL (ref 1500–7800)
Neutrophils Relative %: 60.8 %
Platelets: 522 10*3/uL — ABNORMAL HIGH (ref 140–400)
RBC: 4.94 10*6/uL (ref 3.80–5.10)
RDW: 17.3 % — ABNORMAL HIGH (ref 11.0–15.0)
Total Lymphocyte: 30 %
WBC: 7.4 10*3/uL (ref 3.8–10.8)

## 2018-11-06 LAB — IRON,TIBC AND FERRITIN PANEL
%SAT: 7 % (calc) — ABNORMAL LOW (ref 16–45)
Ferritin: 9 ng/mL — ABNORMAL LOW (ref 16–154)
Iron: 27 ug/dL — ABNORMAL LOW (ref 40–190)
TIBC: 365 mcg/dL (calc) (ref 250–450)

## 2018-11-06 LAB — LIPID PANEL
Cholesterol: 181 mg/dL (ref <200)
HDL: 37 mg/dL — ABNORMAL LOW (ref >=50)
LDL Cholesterol (Calc): 117 mg/dL (calc) — ABNORMAL HIGH
Non-HDL Cholesterol (Calc): 144 mg/dL (calc) — ABNORMAL HIGH (ref <130)
Total CHOL/HDL Ratio: 4.9 (calc) (ref <5.0)
Triglycerides: 157 mg/dL — ABNORMAL HIGH (ref <150)

## 2018-11-06 LAB — HEMOGLOBIN A1C
Hgb A1c MFr Bld: 6 % of total Hgb — ABNORMAL HIGH (ref <5.7)
Mean Plasma Glucose: 126 (calc)
eAG (mmol/L): 7 (calc)

## 2018-11-06 LAB — TSH: TSH: 0.72 mIU/L

## 2018-11-06 MED ORDER — FERROUS SULFATE 325 (65 FE) MG PO TBEC
325.0000 mg | DELAYED_RELEASE_TABLET | Freq: Every day | ORAL | 1 refills | Status: DC
Start: 2018-11-06 — End: 2020-09-24

## 2018-12-30 DIAGNOSIS — Z20828 Contact with and (suspected) exposure to other viral communicable diseases: Secondary | ICD-10-CM | POA: Diagnosis not present

## 2019-01-06 ENCOUNTER — Encounter: Payer: BC Managed Care – PPO | Admitting: Osteopathic Medicine

## 2019-01-10 IMAGING — US ULTRASOUND RIGHT BREAST LIMITED
1 series · 2 of 2 positions shown · non-contrast
Comparison: Baseline ultrasound

CLINICAL DATA: Recent physical exam was remarkable for a palpable
abnormality in the RIGHT breast 4-5 o'clock position, 5-6
centimeters from the nipple. History of breast cancer in the
patient's grandmother, maternal aunt.

EXAM:
ULTRASOUND OF THE RIGHT BREAST

[Series 1: ultrasound right breast limited · 0.09mm/px · 2 of 2 slices shown]
[im 1/2]
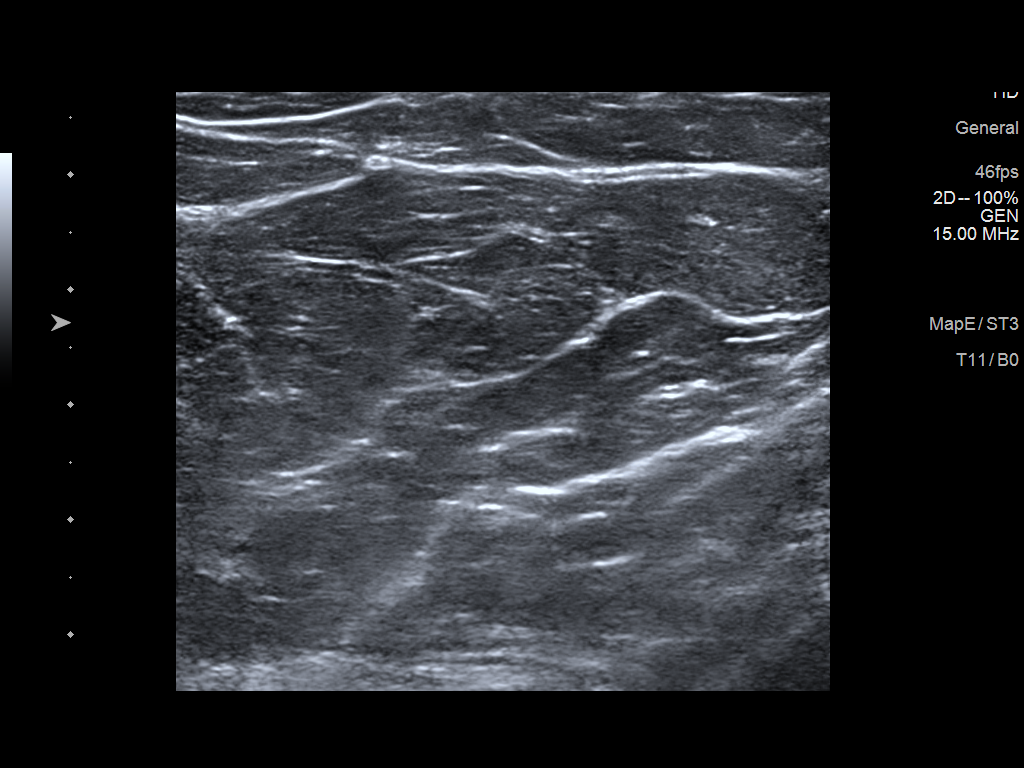
[im 2/2]
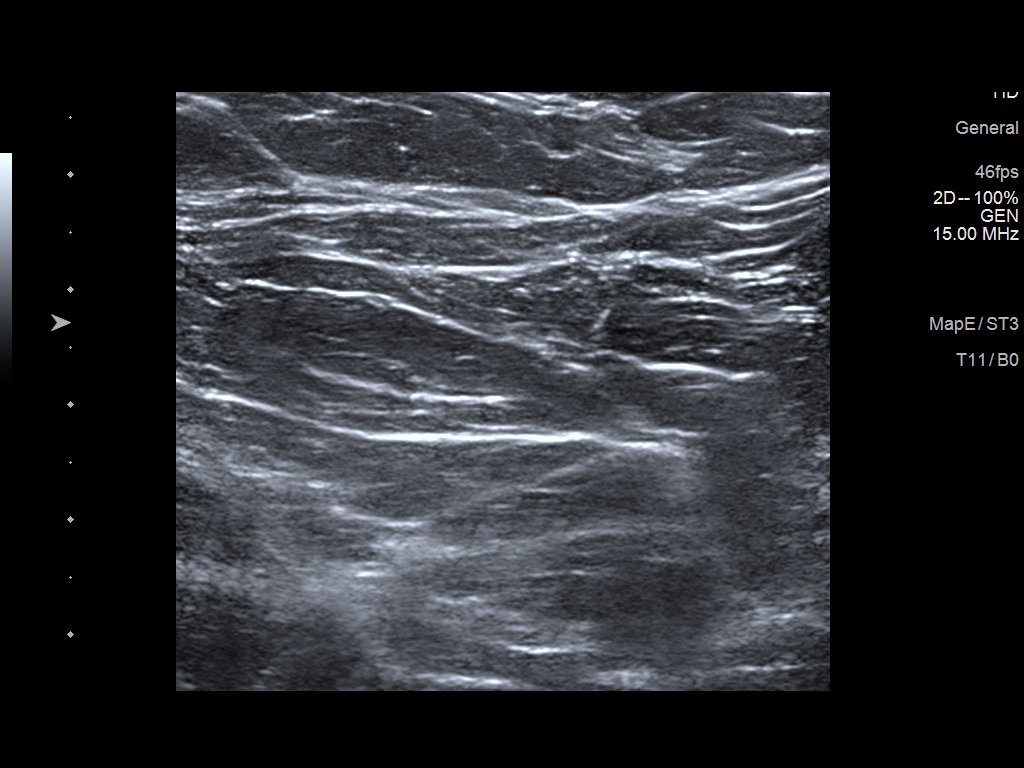

[2 of 2 positions shown; findings below may reference images not displayed]

FINDINGS: On physical exam, I palpate no discrete mass in the LOWER INNER
QUADRANT of the RIGHT breast.

Targeted ultrasound is performed, showing normal appearing
fibroglandular tissue in the LOWER INNER QUADRANT of the RIGHT
breast. No suspicious mass, distortion, or acoustic shadowing is
demonstrated with ultrasound.
IMPRESSION: No ultrasound evidence for malignancy.

RECOMMENDATION:
Screening mammogram at age 40 unless there are persistent or
intervening clinical concerns. (Code:[id number])

I have discussed the findings and recommendations with the patient.
Results were also provided in writing at the conclusion of the
visit. If applicable, a reminder letter will be sent to the patient
regarding the next appointment.

BI-RADS CATEGORY  1: Negative.

## 2019-02-10 ENCOUNTER — Other Ambulatory Visit (HOSPITAL_COMMUNITY)
Admission: RE | Admit: 2019-02-10 | Discharge: 2019-02-10 | Disposition: A | Discharge status: Home / Self Care | Payer: BC Managed Care – PPO | Source: Ambulatory Visit | Attending: Osteopathic Medicine | Admitting: Osteopathic Medicine

## 2019-02-10 ENCOUNTER — Ambulatory Visit (INDEPENDENT_AMBULATORY_CARE_PROVIDER_SITE_OTHER): Payer: BC Managed Care – PPO | Admitting: Osteopathic Medicine

## 2019-02-10 ENCOUNTER — Other Ambulatory Visit: Payer: Self-pay

## 2019-02-10 ENCOUNTER — Encounter: Payer: Self-pay | Admitting: Osteopathic Medicine

## 2019-02-10 VITALS — BP 134/86 | HR 83 | Temp 98.5°F | Wt 206.1 lb

## 2019-02-10 DIAGNOSIS — Z0000 Encounter for general adult medical examination without abnormal findings: Secondary | ICD-10-CM

## 2019-02-10 DIAGNOSIS — D509 Iron deficiency anemia, unspecified: Secondary | ICD-10-CM

## 2019-02-10 DIAGNOSIS — R87610 Atypical squamous cells of undetermined significance on cytologic smear of cervix (ASC-US): Secondary | ICD-10-CM | POA: Diagnosis not present

## 2019-02-10 DIAGNOSIS — D234 Other benign neoplasm of skin of scalp and neck: Secondary | ICD-10-CM

## 2019-02-10 DIAGNOSIS — Z3169 Encounter for other general counseling and advice on procreation: Secondary | ICD-10-CM

## 2019-02-10 LAB — IRON,TIBC AND FERRITIN PANEL
%SAT: 6 % (calc) — ABNORMAL LOW (ref 16–45)
Ferritin: 10 ng/mL — ABNORMAL LOW (ref 16–154)
Iron: 24 ug/dL — ABNORMAL LOW (ref 40–190)
TIBC: 420 mcg/dL (calc) (ref 250–450)

## 2019-02-10 LAB — CBC
HCT: 36.6 % (ref 35.0–45.0)
Hemoglobin: 11.2 g/dL — ABNORMAL LOW (ref 11.7–15.5)
MCH: 22.3 pg — ABNORMAL LOW (ref 27.0–33.0)
MCHC: 30.6 g/dL — ABNORMAL LOW (ref 32.0–36.0)
MCV: 72.9 fL — ABNORMAL LOW (ref 80.0–100.0)
MPV: 10.2 fL (ref 7.5–12.5)
Platelets: 502 10*3/uL — ABNORMAL HIGH (ref 140–400)
RBC: 5.02 10*6/uL (ref 3.80–5.10)
RDW: 18.1 % — ABNORMAL HIGH (ref 11.0–15.0)
WBC: 8.4 10*3/uL (ref 3.8–10.8)

## 2019-02-10 LAB — HM PAP SMEAR: HM Pap smear: NEGATIVE

## 2019-02-10 MED ORDER — CLOBETASOL PROPIONATE 0.05 % EX SOLN: 1.0000 "application " | Freq: Two times a day (BID) | CUTANEOUS | 0 refills | Status: DC

## 2019-02-10 NOTE — Patient Instructions (Addendum)
General Preventive Care  Most recent routine screening lipids/other labs: done 10/2018.   Stable anemia - consider checking for genetic causes of anemia (likely Thalassemia or similar issue)   Prediabetic sugars - stable    Everyone should have blood pressure checked once per year.   Tobacco: don't!   Alcohol: responsible moderation is ok for most adults - if you have concerns about your alcohol intake, please talk to me!   Exercise: as tolerated to reduce risk of cardiovascular disease and diabetes. Strength training will also prevent osteoporosis.   Mental health: if need for mental health care (medicines, counseling, other), or concerns about moods, please let me know!   Sexual health: if need for STD testing, or if concerns with libido/pain problems, please let me know! If you need to discuss your birth control options, please let me know!   Advanced Directive: Living Will and/or Healthcare Power of Attorney recommended for all adults, regardless of age or health.  Vaccines  Flu vaccine: recommended for almost everyone, every fall.   Shingles vaccine: recommended after age 23.   Pneumonia vaccines: recommended after age 74  Tetanus booster: Tdap recommended every 10 years.  Cancer screenings   Colon cancer screening: recommended for everyone at age 62  Breast cancer screening: mammogram recommended at age 44  Cervical cancer screening: Pap every 1 to 5 years depending on age and other risk factors. Can usually stop at age 75 or w/ hysterectomy.   Lung cancer screening: not needed for non-smokers  Infection screenings . HIV: recommended screening at least once age 56-65, more often as needed. . Gonorrhea/Chlamydia: screening as needed, though many insurances "strongly suggest: testing for anyone on birth control. . Hepatitis C: recommended for anyone born 30-1965 (not needed for you) . TB: certain at-risk populations, or depending on work requirements and/or travel  history Other . Bone Density Test: recommended for women at age 48

## 2019-02-10 NOTE — Progress Notes (Signed)
Joy Duke is a 29 y.o. female who presents to  [place] at [facility]  today, 02/10/19, seeking care for the following:  The primary encounter diagnosis was Annual physical exam. 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    New concern: . Has bene trying for baby w/ timed intercourse 6+ months now no success. G1P0 w/ Hx ectopic, no surgery was required.    Chronic, stable: . anemia . Prediabetes . Scalp issue - requests refill of clobetasol, dermatology had rx it for itchy bumps on hairline occipital area described as keloid-like irritation, exam seems c/w this to me.      ASSESSMENT & PLAN with other pertinent history/findings:   1. Annual physical exam Preventive care below  2. ASCUS of cervix with negative high risk HPV Pap today  3. Microcytic anemia CBC, rechecking iron  4. Iron deficiency anemia, unspecified iron deficiency anemia type CBC, rechecking iron  5. 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 Encounter for preconception consultation Discussed in detail. OK to refer to OBGYN may want to see REI or give it more time but w/ Hx ectopic, anemia, prediabetes I'd like her to get in w/ OBGYN to discuss healthy pregnancy.      Orders Placed This Encounter  Procedures  . Hemoglobinopathy evaluation  . CBC  . Fe+TIBC+Fer  . REFER OBGYN    Meds ordered this encounter  Medications  . clobetasol (TEMOVATE) 0.05 % external solution    Sig: Apply 1 application topically 2 (two) times daily.    Dispense:  50 mL    Refill:  0    Patient Instructions  General Preventive Care  Most recent routine screening lipids/other labs: done 10/2018.    Stable anemia - consider checking for genetic causes of anemia (likely Thalassemia or similar issue)   Prediabetic sugars - stable    Everyone should have blood pressure checked once per year.   Tobacco: don't!   Alcohol: responsible moderation is ok for most adults - if you have concerns about your alcohol intake, please talk to me!   Exercise: as tolerated to reduce risk of cardiovascular disease and diabetes. Strength training will also prevent osteoporosis.   Mental health: if need for mental health care (medicines, counseling, other), or concerns about moods, please let me know!   Sexual health: if need for STD testing, or if concerns with libido/pain problems, please let me know! If you need to discuss your birth control options, please let me know!   Advanced Directive: Living Will and/or Healthcare Power of Attorney recommended for all adults, regardless of age or health.  Vaccines  Flu vaccine: recommended for almost everyone, every fall.   Shingles vaccine: recommended after age 74.   Pneumonia vaccines: recommended after age 9  Tetanus booster: Tdap recommended every 10 years.  Cancer screenings   Colon cancer screening: recommended for everyone at age 23  Breast cancer screening: mammogram recommended at age 36  Cervical cancer screening: Pap every 1 to 5 years depending on age and other risk factors. Can usually stop at age 42 or w/ hysterectomy.   Lung cancer screening: not needed for non-smokers  Infection screenings . HIV: recommended screening at least once age  18-65, more often as needed. . Gonorrhea/Chlamydia: screening as needed, though many insurances "strongly suggest: testing for anyone on birth control. . Hepatitis C: recommended for anyone born 54-1965 (not needed for you) . TB: certain at-risk populations, or depending on work requirements and/or travel history Other . Bone Density Test: recommended for women at age  25               Follow-up instructions: Return for RECHECK PENDING RESULTS. OTHERWISE LABS ONLY 3 MOS RECHECK A1C, ANNUAL IN 1 YR .        BP 134/86 (BP Location: Left Arm, Patient Position: Sitting, Cuff Size: Large)   Pulse 83   Temp 98.5 F (36.9 C) (Oral)   Wt 206 lb 1.9 oz (93.5 kg)   BMI 35.94 kg/m   Current Meds  Medication Sig  . escitalopram (LEXAPRO) 20 MG tablet Take 20 mg by mouth daily.  . ferrous sulfate 325 (65 FE) MG EC tablet Take 1 tablet (325 mg total) by mouth daily with breakfast.    No results found for this or any previous visit (from the past 72 hour(s)).  No results found.  Depression screen [facility] 2/9 02/10/2019 08/03/2017  Decreased Interest 0 2  Down, Depressed, Hopeless 0 1  PHQ - 2 Score 0 3  Altered sleeping 0 2  Tired, decreased energy 0 1  Change in appetite 0 1  Feeling bad or failure about yourself  0 1  Trouble concentrating 0 2  Moving slowly or fidgety/restless 0 0  Suicidal thoughts 0 0  PHQ-9 Score 0 10  Difficult doing work/chores Not difficult at all Somewhat difficult    GAD 7 : Generalized Anxiety Score 02/10/2019 08/03/2017  Nervous, Anxious, on Edge 0 2  Control/stop worrying 0 2  Worry too much - different things 0 2  Trouble relaxing 0 1  Restless 0 1  Easily annoyed or irritable 1 1  Afraid - awful might happen 0 1  Total GAD 7 Score 1 10  Anxiety Difficulty Not difficult at all Somewhat difficult      All questions at time of visit were answered - patient instructed to contact office with any additional concerns or updates.  ER/RTC precautions were reviewed with the patient.  Please note: voice recognition software was used to produce this document, and typos may escape review. Please contact Dr. Sheppard Coil for any needed clarifications.

## 2019-02-14 LAB — CYTOLOGY - PAP
Comment: NEGATIVE
Diagnosis: UNDETERMINED — AB
High risk HPV: NEGATIVE

## 2019-03-03 ENCOUNTER — Ambulatory Visit (INDEPENDENT_AMBULATORY_CARE_PROVIDER_SITE_OTHER): Payer: BC Managed Care – PPO | Admitting: Obstetrics & Gynecology

## 2019-03-03 ENCOUNTER — Other Ambulatory Visit: Payer: Self-pay

## 2019-03-03 ENCOUNTER — Encounter: Payer: Self-pay | Admitting: Obstetrics & Gynecology

## 2019-03-03 VITALS — BP 139/86 | HR 84 | Temp 98.3°F | Resp 16 | Ht 62.0 in | Wt 208.0 lb

## 2019-03-03 DIAGNOSIS — R7303 Prediabetes: Secondary | ICD-10-CM

## 2019-03-03 DIAGNOSIS — Z8759 Personal history of other complications of pregnancy, childbirth and the puerperium: Secondary | ICD-10-CM

## 2019-03-03 DIAGNOSIS — N921 Excessive and frequent menstruation with irregular cycle: Secondary | ICD-10-CM

## 2019-03-03 DIAGNOSIS — N979 Female infertility, unspecified: Secondary | ICD-10-CM

## 2019-03-03 MED ORDER — PRENATAL 19 PO CHEW: 1.0000 | CHEWABLE_TABLET | Freq: Every day | ORAL | 12 refills | Status: DC

## 2019-03-03 NOTE — Progress Notes (Signed)
   Subjective:    Patient ID: Joy Duke, female    DOB: April 15, 1990, 29 y.o.   MRN: [id number]  HPI  Pt presents to discuss Ascus x2 in 2021 and 2019.  HPV negative.  Patient states she did have symptoms of bacterial vaginosis during the time the Pap smear was taken.  She does not have the symptoms currently.  Patient also desires to become pregnant.  She is not taking prenatal vitamins.  In 2015 she had an ectopic pregnancy in [facility] and was treated with methotrexate.  She does not use birth control since and has not gotten pregnant.  They have been actively trying since June 2020.  She does have a bleeding every month that is heavy with clots and she does have some spotting in between periods.  Patient denies nipple discharge she does admit to having increased hair growth below her umbilicus.  Patient also is prediabetic with a hemoglobin A1c of 6.0.  Patient has tried to lose weight without success.  Husband has not fathered any other children.  Her only pregnancy is the ectopic pregnancy.  She denies a history of PID, appendicitis, or other pelvic infections.  Review of Systems  Constitutional: Negative.   Respiratory: Negative.   Cardiovascular: Negative.   Gastrointestinal: Negative.   Genitourinary: Negative.        Objective:   Physical Exam Vitals reviewed.  Constitutional:      General: She is not in acute distress.    Appearance: She is well-developed.  HENT:     Head: Normocephalic and atraumatic.  Eyes:     Conjunctiva/sclera: Conjunctivae normal.  Cardiovascular:     Rate and Rhythm: Normal rate.  Pulmonary:     Effort: Pulmonary effort is normal.  Skin:    General: Skin is warm and dry.  Neurological:     Mental Status: She is alert and oriented to person, place, and time.    Vitals:   03/03/19 1005  BP: 139/86  Pulse: 84  Resp: 16  Temp: 98.3 F (36.8 C)  Weight: 208 lb (94.3 kg)  Height: 5\' 2"  (1.575 m)       Assessment & Plan:  1.  Discussed  Pap smear results.  Both times there is ASCUS with HPV negative.  We discussed that the next step is testing again in 1 year.  She understands that cervical cancer is caused by the human papilloma virus and it is very reassuring that she does not have an HPV infection. 2.  Given the need for HSG and semen analysis and metabolic syndrome we will refer to reproductive endocrinology.  Dr. Kerin Perna and his group can perform all of those procedures in their office. 3.  Undergoing stress from trying to lose weight and not being successful as well as difficulties with achieving pregnancy.  We will send her nutrition referral to help her in her weight loss.  She is at risk for preeclampsia and gestational diabetes given her weight, borderline hypertension, and elevated hemoglobin A1c.  Patient is interested in optimizing her health while achieving pregnancy.  20 minutes spent with patient face to face with >50% counseling.

## 2019-03-26 ENCOUNTER — Ambulatory Visit: Payer: BC Managed Care – PPO | Admitting: Dietician

## 2019-04-03 ENCOUNTER — Encounter: Payer: Self-pay | Admitting: Osteopathic Medicine

## 2019-05-13 DIAGNOSIS — R102 Pelvic and perineal pain: Secondary | ICD-10-CM | POA: Diagnosis not present

## 2019-05-13 DIAGNOSIS — E669 Obesity, unspecified: Secondary | ICD-10-CM | POA: Diagnosis not present

## 2019-05-13 DIAGNOSIS — E288 Other ovarian dysfunction: Secondary | ICD-10-CM | POA: Diagnosis not present

## 2019-05-28 DIAGNOSIS — Z3189 Encounter for other procreative management: Secondary | ICD-10-CM | POA: Diagnosis not present

## 2019-05-28 DIAGNOSIS — Z3141 Encounter for fertility testing: Secondary | ICD-10-CM | POA: Diagnosis not present

## 2019-05-30 DIAGNOSIS — Z3189 Encounter for other procreative management: Secondary | ICD-10-CM | POA: Diagnosis not present

## 2019-06-03 DIAGNOSIS — Z3189 Encounter for other procreative management: Secondary | ICD-10-CM | POA: Diagnosis not present

## 2019-06-09 DIAGNOSIS — E288 Other ovarian dysfunction: Secondary | ICD-10-CM | POA: Diagnosis not present

## 2019-06-16 DIAGNOSIS — Z3200 Encounter for pregnancy test, result unknown: Secondary | ICD-10-CM | POA: Diagnosis not present

## 2019-10-08 ENCOUNTER — Encounter: Payer: Self-pay | Admitting: Nurse Practitioner

## 2019-10-08 ENCOUNTER — Other Ambulatory Visit: Payer: Self-pay | Admitting: Nurse Practitioner

## 2019-10-08 DIAGNOSIS — R7303 Prediabetes: Secondary | ICD-10-CM

## 2020-03-09 ENCOUNTER — Encounter: Payer: Self-pay | Admitting: Osteopathic Medicine

## 2020-03-09 ENCOUNTER — Ambulatory Visit (INDEPENDENT_AMBULATORY_CARE_PROVIDER_SITE_OTHER): Payer: BC Managed Care – PPO | Admitting: Osteopathic Medicine

## 2020-03-09 ENCOUNTER — Other Ambulatory Visit: Payer: Self-pay

## 2020-03-09 VITALS — BP 160/99 | HR 102 | Temp 98.2°F | Resp 20 | Ht 62.0 in | Wt 214.0 lb

## 2020-03-09 DIAGNOSIS — R7303 Prediabetes: Secondary | ICD-10-CM | POA: Diagnosis not present

## 2020-03-09 DIAGNOSIS — D509 Iron deficiency anemia, unspecified: Secondary | ICD-10-CM | POA: Diagnosis not present

## 2020-03-09 DIAGNOSIS — Z0000 Encounter for general adult medical examination without abnormal findings: Secondary | ICD-10-CM

## 2020-03-09 DIAGNOSIS — N62 Hypertrophy of breast: Secondary | ICD-10-CM | POA: Diagnosis not present

## 2020-03-09 DIAGNOSIS — M546 Pain in thoracic spine: Secondary | ICD-10-CM

## 2020-03-09 DIAGNOSIS — G8929 Other chronic pain: Secondary | ICD-10-CM

## 2020-03-09 MED ORDER — AMBULATORY NON FORMULARY MEDICATION: 99 refills | Status: DC

## 2020-03-09 MED ORDER — CLOBETASOL PROPIONATE 0.05 % EX SOLN: 1.0000 "application " | Freq: Two times a day (BID) | CUTANEOUS | 0 refills | Status: DC

## 2020-03-09 NOTE — Patient Instructions (Addendum)
General Preventive Care  Most recent routine screening labs: ordered today to follow up on routine labs and prediabetic-range sugars.   Blood pressure goal 130/80 or less.   Tobacco: don't!   Alcohol: responsible moderation is ok for most adults - if you have concerns about your alcohol intake, please talk to me!   Exercise: as tolerated to reduce risk of cardiovascular disease and diabetes. Strength training will also prevent osteoporosis.   Mental health: if need for mental health care (medicines, counseling, other), or concerns about moods, please let me know!   Sexual / Reproductive health: if need for STD testing, or if concerns with libido/pain problems, please let me know!   Advanced Directive: Living Will and/or Healthcare Power of Attorney recommended for all adults, regardless of age or health.  Vaccines  Flu vaccine: for almost everyone, every fall.   Shingles vaccine: after age 71.   Pneumonia vaccines: after age 70.  Tetanus booster: every 10 years / 3rd trimester of pregnancy   HPV vaccine: Gardasil up to age 70 to prevent HPV-associated diseases, including certain cancers.   COVID vaccine: STRONGLY RECOMMENDED  Cancer screenings   Colon cancer screening: for everyone age 6-75.   Breast cancer screening: mammogram at age 34  Cervical cancer screening: Pap annually until/unless told otherwise by OBGYN  Lung cancer screening: not needed for non-smokers  Infection screenings  . HIV: recommended screening at least once age 51-65 . Gonorrhea/Chlamydia: screening as needed . Hepatitis C: recommended once for everyone age 58-75 . TB: certain at-risk populations Other . Bone Density Test: recommended for women at age 71

## 2020-03-09 NOTE — Progress Notes (Unsigned)
Joy Duke is a 30 y.o. female who presents to  [place] [facility]  today, 03/09/20, seeking care for the following:  . Annual physical . Requests breast exam and referral for breast reduction d/t enlarged breasts contributing to longstanding thoracic back pain and bilateral shoulder pain  . BP above goal, pt not monitoring at home, reports some stress today, will recheck   BP Readings from Last 3 Encounters:  03/09/20 (!) 160/99  03/03/19 139/86  02/10/19 134/86       ASSESSMENT & PLAN with other pertinent findings:  The primary encounter diagnosis was Annual physical exam. Diagnoses of Microcytic anemia, Prediabetes, Iron deficiency anemia, unspecified iron deficiency anemia type, Large breasts, and Chronic bilateral thoracic back pain were also pertinent to this visit.    Patient Instructions  General Preventive Care  Most recent routine screening labs: ordered today to follow up on routine labs and prediabetic-range sugars.   Blood pressure goal 130/80 or less.   Tobacco: don't!   Alcohol: responsible moderation is ok for most adults - if you have concerns about your alcohol intake, please talk to me!   Exercise: as tolerated to reduce risk of cardiovascular disease and diabetes. Strength training will also prevent osteoporosis.   Mental health: if need for mental health care (medicines, counseling, other), or concerns about moods, please let me know!   Sexual / Reproductive health: if need for STD testing, or if concerns with libido/pain problems, please let me know!   Advanced Directive: Living Will and/or Healthcare Power of Attorney recommended for all adults, regardless of age or health.  Vaccines  Flu vaccine: for almost everyone, every fall.   Shingles vaccine: after age 69.   Pneumonia vaccines: after age 81.  Tetanus booster: every 10 years / 3rd trimester of pregnancy   HPV vaccine: Gardasil up to  age 30 to prevent HPV-associated diseases, including certain cancers.   COVID vaccine: STRONGLY RECOMMENDED  Cancer screenings   Colon cancer screening: for everyone age 61-75.   Breast cancer screening: mammogram at age 57  Cervical cancer screening: Pap annually until/unless told otherwise by OBGYN  Lung cancer screening: not needed for non-smokers  Infection screenings  . HIV: recommended screening at least once age 51-65 . Gonorrhea/Chlamydia: screening as needed . Hepatitis C: recommended once for everyone age 56-75 . TB: certain at-risk populations Other . Bone Density Test: recommended for women at age 33   Orders Placed This Encounter  Procedures  . CBC with Differential/Platelet  . COMPLETE METABOLIC PANEL WITH GFR  . Lipid panel  . TSH  . Hemoglobin A1c  . Fe+TIBC+Fer  . Ambulatory referral to Plastic Surgery    Meds ordered this encounter  Medications  . AMBULATORY NON FORMULARY MEDICATION    Sig: Digital bp monitor cuff around arm Dx borderline hypertension    Dispense:  1 Units    Refill:  99  . clobetasol (TEMOVATE) 0.05 % external solution    Sig: Apply 1 application topically 2 (two) times daily.    Dispense:  50 mL    Refill:  0     See below for relevant physical exam findings  See below for recent lab and imaging results reviewed  Medications, allergies, PMH, PSH, SocH, FamH reviewed below    Follow-up instructions: Return in about 1 year (around 03/09/2021) for ANNUAL CHECK-UP - SEE [place] SOONER IF NEEDED.  Exam:  BP (!) 160/99 (BP Location: Right Arm, Cuff Size: Large)   Pulse (!) 102   Temp 98.2 F (36.8 C) (Oral)   Resp 20   Ht 5\' 2"  (1.575 m)   Wt 214 lb (97.1 kg)   SpO2 99%   BMI 39.14 kg/m   Constitutional: VS see above. General Appearance: alert, well-developed, well-nourished, NAD  Neck: No masses, trachea midline.   Respiratory: Normal respiratory  effort. no wheeze, no rhonchi, no rales  Cardiovascular: S1/S2 normal, no murmur, no rub/gallop auscultated. RRR.   Musculoskeletal: Gait normal. Symmetric and independent movement of all extremities  Abdominal: non-tender, non-distended, no appreciable organomegaly, neg Murphy's, BS WNLx4  Neurological: Normal balance/coordination. No tremor.  Skin: warm, dry, intact.  Psychiatric: Normal judgment/insight. Normal mood and affect. Oriented x3.   BREAST: No rashes/skin changes, normal fibrous breast tissue, no masses or tenderness, normal nipple without discharge, normal axilla   Current Meds  Medication Sig  . AMBULATORY NON FORMULARY MEDICATION Digital bp monitor cuff around arm Dx borderline hypertension  . escitalopram (LEXAPRO) 20 MG tablet Take 20 mg by mouth daily.  . ferrous sulfate 325 (65 FE) MG EC tablet Take 1 tablet (325 mg total) by mouth daily with breakfast.  . Prenatal Vit-Fe Fumarate-FA (PRENATAL 19) tablet Chew 1 tablet by mouth daily.  . QUEtiapine (SEROQUEL) 25 MG tablet TAKE 1 TO 2 TABLETS BY MOUTH AT BEDTIME AS NEEDE FOR INSOMNIA AND MOOD  . [DISCONTINUED] clobetasol (TEMOVATE) 0.05 % external solution Apply 1 application topically 2 (two) times daily.    No Known Allergies  Patient Active Problem List   Diagnosis Date Noted  . ASCUS of cervix with negative high risk HPV 11/14/2017  . Palpitations 08/03/2017  . Rash and nonspecific skin eruption 08/03/2017  . Microcytic anemia 08/03/2017  . Anxiety 08/03/2017  . Prediabetes 08/03/2017    Family History  Problem Relation Age of Onset  . Breast cancer Maternal Grandmother   . Diabetes Maternal Grandmother   . Hypertension Maternal Grandmother     Social History   Tobacco Use  Smoking Status Never Smoker  Smokeless Tobacco Never Used    Past Surgical History:  Procedure Laterality Date  . KIDNEY STONE SURGERY      Immunization History  Administered Date(s) Administered  . Influenza Split  01/24/2011  . PFIZER(Purple Top)SARS-COV-2 Vaccination 07/24/2019, 09/18/2019  . PPD Test 07/16/2014, 09/19/2016    No results found for this or any previous visit (from the past 2160 hour(s)).  No results found.     All questions at time of visit were answered - patient instructed to contact office with any additional concerns or updates. ER/RTC precautions were reviewed with the patient as applicable.   Please note: manual typing as well as voice recognition software may have been used to produce this document - typos may escape review. Please contact Dr. Sheppard Coil for any needed clarifications.

## 2020-03-13 ENCOUNTER — Other Ambulatory Visit: Payer: Self-pay | Admitting: Osteopathic Medicine

## 2020-03-26 ENCOUNTER — Ambulatory Visit: Payer: BC Managed Care – PPO

## 2020-07-02 ENCOUNTER — Institutional Professional Consult (permissible substitution): Payer: BC Managed Care – PPO | Admitting: Plastic Surgery

## 2020-09-24 ENCOUNTER — Encounter: Payer: Self-pay | Admitting: Plastic Surgery

## 2020-09-24 ENCOUNTER — Other Ambulatory Visit: Payer: Self-pay

## 2020-09-24 ENCOUNTER — Ambulatory Visit (INDEPENDENT_AMBULATORY_CARE_PROVIDER_SITE_OTHER): Payer: BC Managed Care – PPO | Admitting: Plastic Surgery

## 2020-09-24 DIAGNOSIS — M542 Cervicalgia: Secondary | ICD-10-CM | POA: Diagnosis not present

## 2020-09-24 DIAGNOSIS — M546 Pain in thoracic spine: Secondary | ICD-10-CM | POA: Diagnosis not present

## 2020-09-24 DIAGNOSIS — N62 Hypertrophy of breast: Secondary | ICD-10-CM

## 2020-09-24 DIAGNOSIS — G8929 Other chronic pain: Secondary | ICD-10-CM | POA: Diagnosis not present

## 2020-09-24 DIAGNOSIS — M549 Dorsalgia, unspecified: Secondary | ICD-10-CM | POA: Insufficient documentation

## 2020-09-24 NOTE — Progress Notes (Signed)
Patient ID: Joy Duke, female    DOB: July 24, 1990, 30 y.o.   MRN: [id number]   Chief Complaint  Patient presents with   Advice Only   Breast Problem    Mammary Hyperplasia: The patient is a 30 y.o. female with a history of mammary hyperplasia for several years.  She has extremely large breasts causing symptoms that include the following: Back pain in the upper and lower back, including neck pain. She pulls or pins her bra straps to provide better lift and relief of the pressure and pain. She notices relief by holding her breast up manually.  Her shoulder straps cause grooves and pain and pressure that requires padding for relief. Pain medication is sometimes required with motrin and tylenol.  Activities that are hindered by enlarged breasts include: exercise and running.  She has tried supportive clothing as well as fitted bras without improvement.  Her breasts are extremely large and fairly symmetric.  She has hyperpigmentation of the inframammary area on both sides.  The sternal to nipple distance on the right is 38 cm and the left is 38 cm.  The IMF distance is 24 cm.  She is 5 feet 2 inches tall and weighs 206 pounds.  The BMI = 37.7.  Preoperative bra size = 40 H cup.  The patient would like to be around a C cup.  The estimated excess breast tissue to be removed at the time of surgery = 700 grams on the left and 700 grams on the right.  Mammogram history: None and the patient is comfortable not getting 1.   Family history of breast cancer: Maternal grandmother.  Tobacco use: None.   The patient expresses the desire to pursue surgical intervention.   Review of Systems  Constitutional: Negative.   Eyes: Negative.   Respiratory: Negative.    Cardiovascular: Negative.   Gastrointestinal: Negative.   Endocrine: Negative.   Genitourinary: Negative.   Musculoskeletal:  Positive for back pain and neck pain.  Skin: Negative.   Hematological: Negative.   Psychiatric/Behavioral:  Negative.     Past Medical History:  Diagnosis Date   Anemia    ASCUS of cervix with negative high risk HPV 11/14/2017   Pap 10/2017, plan repeat cotesting 1 year    Ectopic pregnancy    Kidney stone    Mental disorder    Vaginal Pap smear, abnormal     Past Surgical History:  Procedure Laterality Date   KIDNEY STONE SURGERY        Current Outpatient Medications:    AMBULATORY NON FORMULARY MEDICATION, Digital bp monitor cuff around arm Dx borderline hypertension, Disp: 1 Units, Rfl: 99   clobetasol (TEMOVATE) 0.05 % external solution, Apply 1 application topically 2 (two) times daily., Disp: 50 mL, Rfl: 0   escitalopram (LEXAPRO) 20 MG tablet, Take 20 mg by mouth daily., Disp: , Rfl:    Ferrous Sulfate (IRON) 325 (65 Fe) MG TABS, Take 1 tablet (325 mg total) by mouth daily with breakfast. **PATIENT NEEDS LABS COMPLETED FOR ADDITIONAL REFILLS**, Disp: 30 tablet, Rfl: 0   ferrous sulfate 325 (65 FE) MG EC tablet, Take 1 tablet (325 mg total) by mouth daily with breakfast., Disp: 90 tablet, Rfl: 1   Prenatal Vit-Fe Fumarate-FA (PRENATAL 19) tablet, Chew 1 tablet by mouth daily., Disp: 30 tablet, Rfl: 12   QUEtiapine (SEROQUEL) 25 MG tablet, TAKE 1 TO 2 TABLETS BY MOUTH AT BEDTIME AS NEEDE FOR INSOMNIA AND MOOD, Disp: , Rfl:  Objective:   Vitals:   09/24/20 1349  BP: 124/84  Pulse: (!) 106  SpO2: 96%    Physical Exam Vitals and nursing note reviewed.  Constitutional:      Appearance: Normal appearance.  HENT:     Head: Normocephalic and atraumatic.  Cardiovascular:     Rate and Rhythm: Normal rate.     Pulses: Normal pulses.  Pulmonary:     Effort: Pulmonary effort is normal. No respiratory distress.     Breath sounds: No stridor. No wheezing.  Abdominal:     General: Abdomen is flat. There is no distension.     Tenderness: There is no abdominal tenderness.     Hernia: No hernia is present.  Skin:    General: Skin is warm.     Capillary Refill: Capillary refill  takes less than 2 seconds.     Coloration: Skin is not jaundiced.     Findings: No bruising.  Neurological:     General: No focal deficit present.     Mental Status: She is alert and oriented to person, place, and time.  Psychiatric:        Mood and Affect: Mood normal.        Behavior: Behavior normal.        Thought Content: Thought content normal.        Judgment: Judgment normal.    Assessment & Plan:  Chronic bilateral thoracic back pain  Neck pain  Symptomatic mammary hypertrophy  The procedure the patient selected and that was best for the patient was discussed. The risk were discussed and include but not limited to the following:  Breast asymmetry, fluid accumulation, firmness of the breast, inability to breast feed, loss of nipple or areola, skin loss, change in skin and nipple sensation, fat necrosis of the breast tissue, bleeding, infection and healing delay.  There are risks of anesthesia and injury to nerves or blood vessels.  Allergic reaction to tape, suture and skin glue are possible.  There will be swelling.  Any of these can lead to the need for revisional surgery.  A breast reduction has potential to interfere with diagnostic procedures in the future.  This procedure is best done when the breast is fully developed.  Changes in the breast will continue to occur over time: pregnancy, weight gain or weigh loss.    Total time: 40 minutes. This includes time spent with the patient during the visit as well as time spent before and after the visit reviewing the chart, documenting the encounter and ordering pertinent studies. and literature emailed to the patient.   Physical therapy: Ordered Mammogram: Not indicated  Pictures were obtained of the patient and placed in the chart with the patient's or guardian's permission.  Patient is a very good candidate for bilateral breast reduction with possible liposuction.  She knows to call when she has finished with physical therapy but  we will go ahead and submit for it now.  [place], DO

## 2020-09-24 NOTE — Addendum Note (Signed)
Addended by: Harl Bowie on: 09/24/2020 02:48 PM   Modules accepted: Orders

## 2020-10-05 ENCOUNTER — Telehealth: Payer: Self-pay | Admitting: Plastic Surgery

## 2020-10-05 NOTE — Telephone Encounter (Signed)
Called patient to inform her that her insurance authorization for breast reduction was denied due to "lack of evaluation and management by a doctor for severe neck, shoulder, and back pain not due to other causes." Patient has been referred to physical therapy. Advised her to do the physical therapy and call back when she has completed 6 weeks of PT and we will appeal for reconsideration. Patient stated understanding.

## 2022-01-06 ENCOUNTER — Other Ambulatory Visit: Payer: Self-pay | Admitting: Obstetrics and Gynecology

## 2022-01-06 DIAGNOSIS — Z3200 Encounter for pregnancy test, result unknown: Secondary | ICD-10-CM

## 2022-01-06 DIAGNOSIS — O0910 Supervision of pregnancy with history of ectopic or molar pregnancy, unspecified trimester: Secondary | ICD-10-CM

## 2022-01-09 ENCOUNTER — Other Ambulatory Visit: Payer: Self-pay

## 2022-01-10 ENCOUNTER — Encounter (HOSPITAL_BASED_OUTPATIENT_CLINIC_OR_DEPARTMENT_OTHER): Payer: Self-pay | Admitting: Emergency Medicine

## 2022-01-10 ENCOUNTER — Other Ambulatory Visit: Payer: Self-pay | Admitting: Obstetrics and Gynecology

## 2022-01-10 ENCOUNTER — Inpatient Hospital Stay (HOSPITAL_BASED_OUTPATIENT_CLINIC_OR_DEPARTMENT_OTHER)
Admission: AD | Admit: 2022-01-10 | Discharge: 2022-01-10 | Disposition: A | Discharge status: Home / Self Care | Payer: BC Managed Care – PPO | Attending: Obstetrics and Gynecology | Admitting: Obstetrics and Gynecology

## 2022-01-10 ENCOUNTER — Emergency Department (HOSPITAL_BASED_OUTPATIENT_CLINIC_OR_DEPARTMENT_OTHER): Payer: BC Managed Care – PPO

## 2022-01-10 DIAGNOSIS — O3481 Maternal care for other abnormalities of pelvic organs, first trimester: Secondary | ICD-10-CM | POA: Insufficient documentation

## 2022-01-10 DIAGNOSIS — O09291 Supervision of pregnancy with other poor reproductive or obstetric history, first trimester: Secondary | ICD-10-CM | POA: Insufficient documentation

## 2022-01-10 DIAGNOSIS — O00102 Left tubal pregnancy without intrauterine pregnancy: Secondary | ICD-10-CM

## 2022-01-10 DIAGNOSIS — Z3A01 Less than 8 weeks gestation of pregnancy: Secondary | ICD-10-CM | POA: Diagnosis not present

## 2022-01-10 LAB — CBC WITH DIFFERENTIAL/PLATELET
Abs Immature Granulocytes: 0.02 10*3/uL (ref 0.00–0.07)
Basophils Absolute: 0 10*3/uL (ref 0.0–0.1)
Basophils Relative: 0 %
Eosinophils Absolute: 0 10*3/uL (ref 0.0–0.5)
Eosinophils Relative: 0 %
HCT: 35.1 % — ABNORMAL LOW (ref 36.0–46.0)
Hemoglobin: 10.9 g/dL — ABNORMAL LOW (ref 12.0–15.0)
Immature Granulocytes: 0 %
Lymphocytes Relative: 24 %
Lymphs Abs: 2.4 10*3/uL (ref 0.7–4.0)
MCH: 22.9 pg — ABNORMAL LOW (ref 26.0–34.0)
MCHC: 31.1 g/dL (ref 30.0–36.0)
MCV: 73.6 fL — ABNORMAL LOW (ref 80.0–100.0)
Monocytes Absolute: 0.6 10*3/uL (ref 0.1–1.0)
Monocytes Relative: 6 %
Neutro Abs: 6.9 10*3/uL (ref 1.7–7.7)
Neutrophils Relative %: 70 %
Platelets: 469 10*3/uL — ABNORMAL HIGH (ref 150–400)
RBC: 4.77 MIL/uL (ref 3.87–5.11)
RDW: 19.2 % — ABNORMAL HIGH (ref 11.5–15.5)
WBC: 10.1 10*3/uL (ref 4.0–10.5)
nRBC: 0 % (ref 0.0–0.2)

## 2022-01-10 LAB — COMPREHENSIVE METABOLIC PANEL
ALT: 12 U/L (ref 0–44)
ALT: 14 U/L (ref 0–44)
AST: 19 U/L (ref 15–41)
AST: 16 U/L (ref 15–41)
Albumin: 3.7 g/dL (ref 3.5–5.0)
Albumin: 3.4 g/dL — ABNORMAL LOW (ref 3.5–5.0)
Alkaline Phosphatase: 68 U/L (ref 38–126)
Alkaline Phosphatase: 68 U/L (ref 38–126)
Anion gap: 8 (ref 5–15)
Anion gap: 8 (ref 5–15)
BUN: 10 mg/dL (ref 6–20)
BUN: 7 mg/dL (ref 6–20)
CO2: 22 mmol/L (ref 22–32)
CO2: 23 mmol/L (ref 22–32)
Calcium: 9 mg/dL (ref 8.9–10.3)
Calcium: 9 mg/dL (ref 8.9–10.3)
Chloride: 107 mmol/L (ref 98–111)
Chloride: 104 mmol/L (ref 98–111)
Creatinine, Ser: 0.75 mg/dL (ref 0.44–1.00)
Creatinine, Ser: 0.73 mg/dL (ref 0.44–1.00)
GFR, Estimated: >60 mL/min (ref >60)
GFR, Estimated: >60 mL/min (ref >60)
Glucose, Bld: 121 mg/dL — ABNORMAL HIGH (ref 70–99)
Glucose, Bld: 101 mg/dL — ABNORMAL HIGH (ref 70–99)
Potassium: 4 mmol/L (ref 3.5–5.1)
Potassium: 3.8 mmol/L (ref 3.5–5.1)
Sodium: 137 mmol/L (ref 135–145)
Sodium: 135 mmol/L (ref 135–145)
Total Bilirubin: 0.5 mg/dL (ref 0.3–1.2)
Total Bilirubin: 0.4 mg/dL (ref 0.3–1.2)
Total Protein: 8.2 g/dL — ABNORMAL HIGH (ref 6.5–8.1)
Total Protein: 7.6 g/dL (ref 6.5–8.1)

## 2022-01-10 LAB — ABO/RH: ABO/RH(D): O POS

## 2022-01-10 LAB — HCG, QUANTITATIVE, PREGNANCY: hCG, Beta Chain, Quant, S: 6315 m[IU]/mL — ABNORMAL HIGH (ref <5)

## 2022-01-10 MED ORDER — METHOTREXATE FOR ECTOPIC PREGNANCY
50.0000 mg/m2 | Freq: Once | INTRAMUSCULAR | Status: AC
  Administered 2022-01-10: 100 mg via INTRAMUSCULAR
  Filled 2022-01-10: qty 4

## 2022-01-10 NOTE — ED Triage Notes (Signed)
Pt states she is [redacted] weeks pregnant and is having spotting and sometimes a little heavier than spotting  Pt is c/o left lower quadrant pain   Spotting started at 4am yesterday  Pain started Sunday night and comes and goes

## 2022-01-10 NOTE — MAU Note (Signed)
...  Joy Duke is a 31 y.o. at 61w6dhere in MAU reporting: Sent here for Methotrexate injection per Dr. CGarwin Brothersfor ectopic pregnancy. She reports she has been experiencing left lower abdominal/pelvic pain and yesterday around 0400 she began experiencing vaginal spotting. She reports her pain is intermittent and has been occurring since this past Sunday.  Labs drawn while patient in triage. Dr. CGarwin Brothers notified of patient's arrival.  LMP: 12/07/2021 Onset of complaint:  Pain score: 5/10 left lower abdomen/pelvis  Lab orders placed from triage:  released signed and held orders per Dr. CGarwin Brothers MD.

## 2022-01-10 NOTE — ED Notes (Signed)
Called Patients obgyn and left message with office to call our provider back for Consult at 8:35

## 2022-01-10 NOTE — Discharge Instructions (Signed)
Go directly to the MAU.  Dr. Garwin Brothers will see you then and discuss treatment.

## 2022-01-10 NOTE — ED Provider Notes (Signed)
Assumed care from Dr. Florina Ou at 7 AM.  Patient with mild vaginal spotting and some left lower quadrant pain intermittently since yesterday.  Patient has a prior history of a ectopic pregnancy approximately 10 years ago.  She had been undergoing infertility treatments but had not been using them this pregnancy was conceived without medications.  She had had testing prior to conception that showed no significant issues.  Ultrasound today returns with concern for ectopic pregnancy in the left tube.  hCG is 6315.  Rh status is pending.  Patient sees Dr. Garwin Brothers.  She is not currently having significant pain at this time.  Last menstrual period was 12/07/2021.  8:47 AM Spoke with Dr. Garwin Brothers and they would like pt to come to MAU for further care.  Discussed with pt and her husband.  They will go POV.   Blanchie Dessert, MD 01/10/22 [phone]

## 2022-01-10 NOTE — MAU Provider Note (Signed)
History     Chief Complaint  Patient presents with   Vaginal Bleeding   Methotrexate   31 yo G2P0010 MBF LMP 11/15 presents for MTX due to evaluation at [facility] showed on sonogram no IUP and left adnexal mass suggestive of 1.7 cm left ectopic pregnancy. Pt has hx of ectopic pregnancy in the past . She cannot recall which side it was. Pt has had vaginal spotting and had some pain which is currently not present and thought was gas. Hquant 6300+   OB History     Gravida  2   Para      Term      Preterm      AB  1   Living         SAB      IAB      Ectopic  1   Multiple      Live Births              Past Medical History:  Diagnosis Date   Anemia    ASCUS of cervix with negative high risk HPV 11/14/2017   Pap 10/2017, plan repeat cotesting 1 year    Ectopic pregnancy    Kidney stone    Mental disorder     Past Surgical History:  Procedure Laterality Date   KIDNEY STONE SURGERY      Family History  Problem Relation Age of Onset   Breast cancer Maternal Grandmother    Diabetes Maternal Grandmother    Hypertension Maternal Grandmother     Social History   Tobacco Use   Smoking status: Never   Smokeless tobacco: Never  Vaping Use   Vaping Use: Never used  Substance Use Topics   Alcohol use: Not Currently    Comment: occ   Drug use: Never    Allergies: No Known Allergies  Medications Prior to Admission  Medication Sig Dispense Refill Last Dose   escitalopram (LEXAPRO) 20 MG tablet Take 20 mg by mouth daily.        Physical Exam   Blood pressure 136/66, pulse 87, temperature 98.1 F (36.7 C), temperature source Oral, resp. rate 17, height '5\' 2"'$  (1.575 m), weight 92.9 kg, last menstrual period 12/07/2021, SpO2 99 %.  General appearance: alert, cooperative, and no distress Lungs: clear to auscultation bilaterally Heart: regular rate and rhythm, S1, S2 normal, no murmur, click, rub or gallop Abdomen: soft, non-tender; bowel  sounds normal; no masses,  no organomegaly Pelvic: deferred US OB LESS THAN 14 WEEKS WITH OB TRANSVAGINAL  Result Date: 01/10/2022 CLINICAL DATA:  Is a left ectopic there is a small amount of free fluid in the pelvis I EXAM: OBSTETRIC <14 WK [place] AND TRANSVAGINAL OB US TECHNIQUE: Both transabdominal and transvaginal ultrasound examinations were performed for complete evaluation of the gestation as well as the maternal uterus, adnexal regions, and pelvic cul-de-sac. Transvaginal technique was performed to assess early pregnancy. COMPARISON:  None Available. FINDINGS: Intrauterine gestational sac: None Yolk sac:  Not Visualized. Embryo:  Not Visualized. Cardiac Activity: Not Visualized. Maternal uterus/adnexae: Subchorionic hemorrhage: NA Right ovary: Appears normal. Left ovary: There is a anechoic cyst within the left ovary which measures 3.1 x 3.5 x 3.5 cm. This exhibits increased through transmission without internal septation or mural nodule. Other :Within the left adnexa, adjacent to the left ovary there is a small mass with central hypoechoic cystic area measuring 1.5 x 1.3 x 1.7 cm. Free fluid: There is a small volume of  free fluid noted within the pelvis. IMPRESSION: 1. No intrauterine gestational sac, yolk sac, or fetal pole identified. Differential considerations include intrauterine pregnancy too early to be sonographically visualized, missed abortion, or ectopic pregnancy. 2. Solid and cystic structure within the left adnexa adjacent to the left ovary measures 1.7 cm and is concerning for ectopic pregnancy. 3. Small volume of free fluid noted within the pelvis. Critical Value/emergent results were called by telephone at the time of interpretation on 01/10/2022 at 8:04 am to provider Dr. Johny Blamer, who verbally acknowledged these results. Electronically Signed   By: Kerby Moors M.D.   On: 01/10/2022 08:05    Recent Results (from the past 2160 hour(s))  hCG, quantitative, pregnancy     Status:  Abnormal   Collection Time: 01/10/22  5:18 AM  Result Value Ref Range   hCG, Beta Chain, Quant, S 6,315 (H) <5 mIU/mL    Comment:          GEST. AGE      CONC.  (mIU/mL)   <=1 WEEK        5 - 50     2 WEEKS       50 - 500     3 WEEKS       100 - 10,000     4 WEEKS     1,000 - 30,000     5 WEEKS     3,500 - 115,000   6-8 WEEKS     12,000 - 270,000    12 WEEKS     15,000 - 220,000        FEMALE AND NON-PREGNANT FEMALE:     LESS THAN 5 mIU/mL Performed at [facility], [address] [place], [place] [address]   Comprehensive metabolic panel     Status: Abnormal   Collection Time: 01/10/22  5:18 AM  Result Value Ref Range   Sodium 137 135 - 145 mmol/L   Potassium 4.0 3.5 - 5.1 mmol/L   Chloride 107 98 - 111 mmol/L   CO2 22 22 - 32 mmol/L   Glucose, Bld 121 (H) 70 - 99 mg/dL    Comment: Glucose reference range applies only to samples taken after fasting for at least 8 hours.   BUN 10 6 - 20 mg/dL   Creatinine, Ser 0.75 0.44 - 1.00 mg/dL   Calcium 9.0 8.9 - 10.3 mg/dL   Total Protein 8.2 (H) 6.5 - 8.1 g/dL   Albumin 3.7 3.5 - 5.0 g/dL   AST 19 15 - 41 U/L   ALT 12 0 - 44 U/L   Alkaline Phosphatase 68 38 - 126 U/L   Total Bilirubin 0.5 0.3 - 1.2 mg/dL   GFR, Estimated >60 >60 mL/min    Comment: (NOTE) Calculated using the CKD-EPI Creatinine Equation (2021)    Anion gap 8 5 - 15    Comment: Performed at [facility], [address] [place], [place] [address]  ABO/Rh     Status: None   Collection Time: 01/10/22  5:19 AM  Result Value Ref Range   ABO/RH(D) O POS    No rh immune globuloin      NOT A RH IMMUNE GLOBULIN CANDIDATE, PT RH POSITIVE Performed at [facility] [address] [place], [place] [address]   CBC with Differential/Platelet     Status: Abnormal   Collection Time: 01/10/22  9:38 AM  Result Value Ref Range   WBC 10.1 4.0 - 10.5  K/uL   RBC 4.77 3.87 - 5.11 MIL/uL   Hemoglobin 10.9 (L) 12.0 - 15.0 g/dL   HCT 35.1 (L) 36.0 -  46.0 %   MCV 73.6 (L) 80.0 - 100.0 fL   MCH 22.9 (L) 26.0 - 34.0 pg   MCHC 31.1 30.0 - 36.0 g/dL   RDW 19.2 (H) 11.5 - 15.5 %   Platelets 469 (H) 150 - 400 K/uL   nRBC 0.0 0.0 - 0.2 %   Neutrophils Relative % 70 %   Neutro Abs 6.9 1.7 - 7.7 K/uL   Lymphocytes Relative 24 %   Lymphs Abs 2.4 0.7 - 4.0 K/uL   Monocytes Relative 6 %   Monocytes Absolute 0.6 0.1 - 1.0 K/uL   Eosinophils Relative 0 %   Eosinophils Absolute 0.0 0.0 - 0.5 K/uL   Basophils Relative 0 %   Basophils Absolute 0.0 0.0 - 0.1 K/uL   Immature Granulocytes 0 %   Abs Immature Granulocytes 0.02 0.00 - 0.07 K/uL    Comment: Performed at [facility] [address], [place], [place] [address]  Comprehensive metabolic panel     Status: Abnormal   Collection Time: 01/10/22  9:38 AM  Result Value Ref Range   Sodium 135 135 - 145 mmol/L   Potassium 3.8 3.5 - 5.1 mmol/L   Chloride 104 98 - 111 mmol/L   CO2 23 22 - 32 mmol/L   Glucose, Bld 101 (H) 70 - 99 mg/dL    Comment: Glucose reference range applies only to samples taken after fasting for at least 8 hours.   BUN 7 6 - 20 mg/dL   Creatinine, Ser 0.73 0.44 - 1.00 mg/dL   Calcium 9.0 8.9 - 10.3 mg/dL   Total Protein 7.6 6.5 - 8.1 g/dL   Albumin 3.4 (L) 3.5 - 5.0 g/dL   AST 16 15 - 41 U/L   ALT 14 0 - 44 U/L   Alkaline Phosphatase 68 38 - 126 U/L   Total Bilirubin 0.4 0.3 - 1.2 mg/dL   GFR, Estimated >60 >60 mL/min    Comment: (NOTE) Calculated using the CKD-EPI Creatinine Equation (2021)    Anion gap 8 5 - 15    Comment: Performed at [facility] [address], [place], [place] [address]    ED Course  IMP: presumed left ectopic pregnancy Disc with pt surgical removal of involved tube vs MTX. Pro and con of both disc with pt and her husband. Pt opts for MTX. Disc HSG after this resolves. Pt reports having had HSG ~2 yr ago with nl tubes at the time.  P ) MTX today. Ectopic pregnancy warning reviewed.  Return Friday for Hquant  and D7 for repeat  lab. To abstain from intercourse and stop MVI D/c home MDM   Marvene Staff, MD 10:14 AM 01/10/2022

## 2022-01-10 NOTE — ED Provider Notes (Signed)
[facility] Provider Note: Joy Spurling, MD, FACEP  CSN: [id number] MRN: [id number] ARRIVAL: 01/10/22 at [place]: [place]  Vaginal Bleeding and Methotrexate    HISTORY OF PRESENT ILLNESS  01/10/22 5:54 AM Joy Duke is a 31 y.o. female who is about [redacted] weeks pregnant and has been having spotting and sometimes vaginal bleeding a little heavier than spotting since 4 AM yesterday morning.  She is also having left lower quadrant pain for about the last 36 hours which has been coming and going.  She states it feels like gas and is not present currently.   Past Medical History:  Diagnosis Date   Anemia    ASCUS of cervix with negative high risk HPV 11/14/2017   Pap 10/2017, plan repeat cotesting 1 year    Ectopic pregnancy    Kidney stone    Mental disorder     Past Surgical History:  Procedure Laterality Date   KIDNEY STONE SURGERY      Family History  Problem Relation Age of Onset   Breast cancer Maternal Grandmother    Diabetes Maternal Grandmother    Hypertension Maternal Grandmother     Social History   Tobacco Use   Smoking status: Never   Smokeless tobacco: Never  Vaping Use   Vaping Use: Never used  Substance Use Topics   Alcohol use: Not Currently    Comment: occ   Drug use: Never    Prior to Admission medications   Medication Sig Start Date End Date Taking? Authorizing Provider  escitalopram (LEXAPRO) 20 MG tablet Take 20 mg by mouth daily. 08/15/18   [provider]    Allergies Patient has no known allergies.   REVIEW OF SYSTEMS  Negative except as noted here or in the History of Present Illness.   PHYSICAL EXAMINATION  Initial Vital Signs Blood pressure 138/82, pulse 87, temperature 98.4 F (36.9 C), temperature source Oral, resp. rate 14, height '5\' 2"'$  (1.575 m), weight 90.7 kg, last menstrual period 12/06/2021, SpO2 99 %.  Examination General: Well-developed, well-nourished female in no  acute distress; appearance consistent with age of record HENT: normocephalic; atraumatic Eyes: Normal appearance Neck: supple Heart: regular rate and rhythm Lungs: clear to auscultation bilaterally Abdomen: soft; nondistended; nontender; bowel sounds present GU: Normal external genitalia; vaginal spotting; cervical os closed; no cervical motion tenderness Extremities: No deformity; full range of motion; pulses normal Neurologic: Awake, alert and oriented; motor function intact in all extremities and symmetric; no facial droop Skin: Warm and dry Psychiatric: Normal mood and affect   RESULTS  Summary of this visit's results, reviewed and interpreted by myself:   EKG Interpretation  Date/Time:    Ventricular Rate:    PR Interval:    QRS Duration:   QT Interval:    QTC Calculation:   R Axis:     Text Interpretation:         Laboratory Studies: Results for orders placed or performed during the hospital encounter of 01/10/22 (from the past 24 hour(s))  hCG, quantitative, pregnancy     Status: Abnormal   Collection Time: 01/10/22  5:18 AM  Result Value Ref Range   hCG, Beta Chain, Quant, S 6,315 (H) <5 mIU/mL  Comprehensive metabolic panel     Status: Abnormal   Collection Time: 01/10/22  5:18 AM  Result Value Ref Range   Sodium 137 135 - 145 mmol/L   Potassium 4.0 3.5 - 5.1 mmol/L   Chloride 107  98 - 111 mmol/L   CO2 22 22 - 32 mmol/L   Glucose, Bld 121 (H) 70 - 99 mg/dL   BUN 10 6 - 20 mg/dL   Creatinine, Ser 0.75 0.44 - 1.00 mg/dL   Calcium 9.0 8.9 - 10.3 mg/dL   Total Protein 8.2 (H) 6.5 - 8.1 g/dL   Albumin 3.7 3.5 - 5.0 g/dL   AST 19 15 - 41 U/L   ALT 12 0 - 44 U/L   Alkaline Phosphatase 68 38 - 126 U/L   Total Bilirubin 0.5 0.3 - 1.2 mg/dL   GFR, Estimated >60 >60 mL/min   Anion gap 8 5 - 15  ABO/Rh     Status: None   Collection Time: 01/10/22  5:19 AM  Result Value Ref Range   ABO/RH(D) O POS    No rh immune globuloin      NOT A RH IMMUNE GLOBULIN  CANDIDATE, PT RH POSITIVE Performed at [facility] [address], [place], [place] [address]   CBC with Differential/Platelet     Status: Abnormal   Collection Time: 01/10/22  9:38 AM  Result Value Ref Range   WBC 10.1 4.0 - 10.5 K/uL   RBC 4.77 3.87 - 5.11 MIL/uL   Hemoglobin 10.9 (L) 12.0 - 15.0 g/dL   HCT 35.1 (L) 36.0 - 46.0 %   MCV 73.6 (L) 80.0 - 100.0 fL   MCH 22.9 (L) 26.0 - 34.0 pg   MCHC 31.1 30.0 - 36.0 g/dL   RDW 19.2 (H) 11.5 - 15.5 %   Platelets 469 (H) 150 - 400 K/uL   nRBC 0.0 0.0 - 0.2 %   Neutrophils Relative % 70 %   Neutro Abs 6.9 1.7 - 7.7 K/uL   Lymphocytes Relative 24 %   Lymphs Abs 2.4 0.7 - 4.0 K/uL   Monocytes Relative 6 %   Monocytes Absolute 0.6 0.1 - 1.0 K/uL   Eosinophils Relative 0 %   Eosinophils Absolute 0.0 0.0 - 0.5 K/uL   Basophils Relative 0 %   Basophils Absolute 0.0 0.0 - 0.1 K/uL   Immature Granulocytes 0 %   Abs Immature Granulocytes 0.02 0.00 - 0.07 K/uL  Comprehensive metabolic panel     Status: Abnormal   Collection Time: 01/10/22  9:38 AM  Result Value Ref Range   Sodium 135 135 - 145 mmol/L   Potassium 3.8 3.5 - 5.1 mmol/L   Chloride 104 98 - 111 mmol/L   CO2 23 22 - 32 mmol/L   Glucose, Bld 101 (H) 70 - 99 mg/dL   BUN 7 6 - 20 mg/dL   Creatinine, Ser 0.73 0.44 - 1.00 mg/dL   Calcium 9.0 8.9 - 10.3 mg/dL   Total Protein 7.6 6.5 - 8.1 g/dL   Albumin 3.4 (L) 3.5 - 5.0 g/dL   AST 16 15 - 41 U/L   ALT 14 0 - 44 U/L   Alkaline Phosphatase 68 38 - 126 U/L   Total Bilirubin 0.4 0.3 - 1.2 mg/dL   GFR, Estimated >60 >60 mL/min   Anion gap 8 5 - 15   Imaging Studies: US OB LESS THAN 14 WEEKS WITH OB TRANSVAGINAL  Result Date: 01/10/2022 CLINICAL DATA:  Is a left ectopic there is a small amount of free fluid in the pelvis I EXAM: OBSTETRIC <14 WK [place] AND TRANSVAGINAL OB US TECHNIQUE: Both transabdominal and transvaginal ultrasound examinations were performed for complete evaluation of the gestation as well as the maternal  uterus, adnexal regions, and pelvic cul-de-sac. Transvaginal technique was performed to assess early pregnancy. COMPARISON:  None Available. FINDINGS: Intrauterine gestational sac: None Yolk sac:  Not Visualized. Embryo:  Not Visualized. Cardiac Activity: Not Visualized. Maternal uterus/adnexae: Subchorionic hemorrhage: NA Right ovary: Appears normal. Left ovary: There is a anechoic cyst within the left ovary which measures 3.1 x 3.5 x 3.5 cm. This exhibits increased through transmission without internal septation or mural nodule. Other :Within the left adnexa, adjacent to the left ovary there is a small mass with central hypoechoic cystic area measuring 1.5 x 1.3 x 1.7 cm. Free fluid: There is a small volume of free fluid noted within the pelvis. IMPRESSION: 1. No intrauterine gestational sac, yolk sac, or fetal pole identified. Differential considerations include intrauterine pregnancy too early to be sonographically visualized, missed abortion, or ectopic pregnancy. 2. Solid and cystic structure within the left adnexa adjacent to the left ovary measures 1.7 cm and is concerning for ectopic pregnancy. 3. Small volume of free fluid noted within the pelvis. Critical Value/emergent results were called by telephone at the time of interpretation on 01/10/2022 at 8:04 am to provider Dr. Johny Blamer, who verbally acknowledged these results. Electronically Signed   By: Kerby Moors M.D.   On: 01/10/2022 08:05    ED COURSE and MDM  Nursing notes, initial and subsequent vitals signs, including pulse oximetry, reviewed and interpreted by myself.  Vitals:   01/10/22 0741 01/10/22 0825 01/10/22 0936 01/10/22 1122  BP: 125/80  136/66 132/82  Pulse: 94  87 76  Resp: '16  17 15  '$ Temp:  99.5 F (37.5 C) 98.1 F (36.7 C) 99.5 F (37.5 C)  TempSrc:  Oral Oral Oral  SpO2: 100%  99% 100%  Weight:   92.9 kg   Height:   '5\' 2"'$  (1.575 m)    Medications  methotrexate (for ectopic pregnancy) 25 mg/mL chemo injection (100  mg Intramuscular Given 01/10/22 1136)   7:00 AM Signed out to Dr. Maryan Rued.  Pelvic ultrasound pending.  Although threatened miscarriage is the most likely diagnosis the intermittent pain in the left lower quadrant is concerning for possible ectopic pregnancy.   PROCEDURES  Procedures   ED DIAGNOSES     ICD-10-CM   1. Left tubal pregnancy without intrauterine pregnancy  O00.102          Sharika Mosquera, MD 01/10/22 2238

## 2022-01-11 ENCOUNTER — Other Ambulatory Visit: Payer: Self-pay | Admitting: Obstetrics and Gynecology

## 2022-01-11 DIAGNOSIS — O00102 Left tubal pregnancy without intrauterine pregnancy: Secondary | ICD-10-CM | POA: Insufficient documentation

## 2022-01-13 ENCOUNTER — Inpatient Hospital Stay (HOSPITAL_COMMUNITY)
Admission: AD | Admit: 2022-01-13 | Discharge: 2022-01-13 | Disposition: A | Discharge status: Home / Self Care | Payer: BC Managed Care – PPO | Attending: Obstetrics and Gynecology | Admitting: Obstetrics and Gynecology

## 2022-01-13 ENCOUNTER — Other Ambulatory Visit: Payer: Self-pay

## 2022-01-13 DIAGNOSIS — Z3201 Encounter for pregnancy test, result positive: Secondary | ICD-10-CM | POA: Diagnosis not present

## 2022-01-13 LAB — HCG, QUANTITATIVE, PREGNANCY: hCG, Beta Chain, Quant, S: 5105 m[IU]/mL — ABNORMAL HIGH (ref <5)

## 2022-01-13 NOTE — MAU Note (Signed)
Joy Duke is a 31 y.o. at 68w2dhere in MAU reporting: she's here for HCG level.  Denies pain.  Endorses "fairly light"  VB, doesn't fill up a pad. LMP: 12/06/2021 Onset of complaint: today Pain score: 0 Vitals:   01/13/22 0858  BP: 136/81  Pulse: 81  Resp: 18  Temp: 98.9 F (37.2 C)  SpO2: 99%     FHT:NA Lab orders placed from triage:   HCG Level

## 2022-01-14 ENCOUNTER — Other Ambulatory Visit: Payer: Self-pay | Admitting: Obstetrics and Gynecology

## 2022-01-14 DIAGNOSIS — O00102 Left tubal pregnancy without intrauterine pregnancy: Secondary | ICD-10-CM

## 2022-01-16 ENCOUNTER — Encounter (HOSPITAL_COMMUNITY): Payer: Self-pay | Admitting: Obstetrics and Gynecology

## 2022-01-16 ENCOUNTER — Inpatient Hospital Stay (HOSPITAL_COMMUNITY)
Admission: AD | Admit: 2022-01-16 | Discharge: 2022-01-16 | Disposition: A | Discharge status: Home / Self Care | Payer: BC Managed Care – PPO | Attending: Obstetrics and Gynecology | Admitting: Obstetrics and Gynecology

## 2022-01-16 DIAGNOSIS — O00102 Left tubal pregnancy without intrauterine pregnancy: Secondary | ICD-10-CM | POA: Insufficient documentation

## 2022-01-16 LAB — HCG, QUANTITATIVE, PREGNANCY: hCG, Beta Chain, Quant, S: 3332 m[IU]/mL — ABNORMAL HIGH (ref <5)

## 2022-01-16 NOTE — MAU Note (Signed)
Pt here for day & MTX  HCG. Pt denies any pain and reports a small amount of spotting that is unchanged from the other day. BP 137/79   Pulse 88   Resp 18   LMP 12/07/2021

## 2022-01-16 NOTE — MAU Provider Note (Signed)
[name] is a 31 y.o. G2P0010 patient who presents to MAU today for a repeat of labs O BP 137/79   Pulse 88   Resp 18   LMP 12/07/2021   Physical Exam Gen: Alert and active, not in distress.  Resp: no respiratory distress Cardio: normal PR  A Medical screening exam complete   P Plan of care per Dr Garwin Brothers.  Stormy Card, MD 01/16/2022 5:12 PM

## 2022-01-17 ENCOUNTER — Other Ambulatory Visit: Payer: Self-pay | Admitting: Obstetrics and Gynecology

## 2022-01-17 DIAGNOSIS — O00102 Left tubal pregnancy without intrauterine pregnancy: Secondary | ICD-10-CM

## 2022-01-23 ENCOUNTER — Inpatient Hospital Stay (HOSPITAL_COMMUNITY)
Admission: AD | Admit: 2022-01-23 | Discharge: 2022-01-23 | Disposition: A | Discharge status: Home / Self Care | Payer: BC Managed Care – PPO | Attending: Obstetrics and Gynecology | Admitting: Obstetrics and Gynecology

## 2022-01-23 ENCOUNTER — Other Ambulatory Visit (HOSPITAL_COMMUNITY)
Admission: RE | Admit: 2022-01-23 | Discharge: 2022-01-23 | Disposition: A | Discharge status: Home / Self Care | Payer: BC Managed Care – PPO | Source: Home / Self Care | Attending: Obstetrics and Gynecology | Admitting: Obstetrics and Gynecology

## 2022-01-23 DIAGNOSIS — O00102 Left tubal pregnancy without intrauterine pregnancy: Secondary | ICD-10-CM | POA: Insufficient documentation

## 2022-01-23 LAB — HCG, QUANTITATIVE, PREGNANCY: hCG, Beta Chain, Quant, S: 696 m[IU]/mL — ABNORMAL HIGH (ref <5)

## 2022-01-25 ENCOUNTER — Other Ambulatory Visit: Payer: Self-pay | Admitting: Obstetrics and Gynecology

## 2022-01-25 DIAGNOSIS — O00102 Left tubal pregnancy without intrauterine pregnancy: Secondary | ICD-10-CM

## 2022-01-30 ENCOUNTER — Other Ambulatory Visit: Payer: BC Managed Care – PPO

## 2022-01-30 LAB — HCG, QUANTITATIVE, PREGNANCY: hCG, Beta Chain, Quant, S: 176 m[IU]/mL — ABNORMAL HIGH (ref <5)

## 2022-02-05 ENCOUNTER — Other Ambulatory Visit: Payer: Self-pay | Admitting: Obstetrics and Gynecology

## 2022-02-05 DIAGNOSIS — O00102 Left tubal pregnancy without intrauterine pregnancy: Secondary | ICD-10-CM

## 2022-02-07 ENCOUNTER — Other Ambulatory Visit (HOSPITAL_COMMUNITY)
Admission: AD | Admit: 2022-02-07 | Discharge: 2022-02-07 | Disposition: A | Discharge status: Home / Self Care | Payer: BC Managed Care – PPO | Attending: Obstetrics and Gynecology | Admitting: Obstetrics and Gynecology

## 2022-02-07 DIAGNOSIS — Z3201 Encounter for pregnancy test, result positive: Secondary | ICD-10-CM | POA: Diagnosis present

## 2022-02-07 LAB — HCG, QUANTITATIVE, PREGNANCY: hCG, Beta Chain, Quant, S: 39 m[IU]/mL — ABNORMAL HIGH (ref <5)

## 2022-02-10 ENCOUNTER — Other Ambulatory Visit: Payer: Self-pay | Admitting: Obstetrics and Gynecology

## 2022-02-10 DIAGNOSIS — O00102 Left tubal pregnancy without intrauterine pregnancy: Secondary | ICD-10-CM

## 2022-02-13 ENCOUNTER — Other Ambulatory Visit (HOSPITAL_COMMUNITY)
Admission: RE | Admit: 2022-02-13 | Discharge: 2022-02-13 | Disposition: A | Discharge status: Home / Self Care | Payer: BC Managed Care – PPO | Source: Ambulatory Visit | Attending: Obstetrics and Gynecology | Admitting: Obstetrics and Gynecology

## 2022-02-13 DIAGNOSIS — O00102 Left tubal pregnancy without intrauterine pregnancy: Secondary | ICD-10-CM | POA: Insufficient documentation

## 2022-02-13 LAB — HCG, QUANTITATIVE, PREGNANCY: hCG, Beta Chain, Quant, S: 24 m[IU]/mL — ABNORMAL HIGH (ref <5)

## 2023-01-08 ENCOUNTER — Inpatient Hospital Stay (HOSPITAL_COMMUNITY): Payer: BC Managed Care – PPO

## 2023-01-08 ENCOUNTER — Encounter (HOSPITAL_COMMUNITY): Payer: Self-pay | Admitting: Obstetrics and Gynecology

## 2023-01-08 ENCOUNTER — Inpatient Hospital Stay (HOSPITAL_COMMUNITY)
Admission: AD | Admit: 2023-01-08 | Discharge: 2023-01-08 | Disposition: A | Discharge status: Home / Self Care | Payer: BC Managed Care – PPO | Attending: Obstetrics & Gynecology | Admitting: Obstetrics & Gynecology

## 2023-01-08 DIAGNOSIS — O3481 Maternal care for other abnormalities of pelvic organs, first trimester: Secondary | ICD-10-CM | POA: Insufficient documentation

## 2023-01-08 DIAGNOSIS — Z3A01 Less than 8 weeks gestation of pregnancy: Secondary | ICD-10-CM | POA: Diagnosis not present

## 2023-01-08 DIAGNOSIS — N83202 Unspecified ovarian cyst, left side: Secondary | ICD-10-CM | POA: Insufficient documentation

## 2023-01-08 DIAGNOSIS — R102 Pelvic and perineal pain: Secondary | ICD-10-CM

## 2023-01-08 DIAGNOSIS — O26891 Other specified pregnancy related conditions, first trimester: Secondary | ICD-10-CM

## 2023-01-08 DIAGNOSIS — O3680X0 Pregnancy with inconclusive fetal viability, not applicable or unspecified: Secondary | ICD-10-CM | POA: Insufficient documentation

## 2023-01-08 DIAGNOSIS — O209 Hemorrhage in early pregnancy, unspecified: Secondary | ICD-10-CM

## 2023-01-08 LAB — URINALYSIS, ROUTINE W REFLEX MICROSCOPIC
Bilirubin Urine: NEGATIVE
Glucose, UA: NEGATIVE mg/dL
Ketones, ur: NEGATIVE mg/dL
Leukocytes,Ua: NEGATIVE
Nitrite: NEGATIVE
Protein, ur: NEGATIVE mg/dL
Specific Gravity, Urine: 1.004 — ABNORMAL LOW (ref 1.005–1.030)
pH: 6 (ref 5.0–8.0)

## 2023-01-08 LAB — CBC
HCT: 34.8 % — ABNORMAL LOW (ref 36.0–46.0)
Hemoglobin: 10.8 g/dL — ABNORMAL LOW (ref 12.0–15.0)
MCH: 22.8 pg — ABNORMAL LOW (ref 26.0–34.0)
MCHC: 31 g/dL (ref 30.0–36.0)
MCV: 73.6 fL — ABNORMAL LOW (ref 80.0–100.0)
Platelets: 441 10*3/uL — ABNORMAL HIGH (ref 150–400)
RBC: 4.73 MIL/uL (ref 3.87–5.11)
RDW: 18.9 % — ABNORMAL HIGH (ref 11.5–15.5)
WBC: 14.7 10*3/uL — ABNORMAL HIGH (ref 4.0–10.5)
nRBC: 0 % (ref 0.0–0.2)

## 2023-01-08 LAB — COMPREHENSIVE METABOLIC PANEL
ALT: 14 U/L (ref 0–44)
AST: 17 U/L (ref 15–41)
Albumin: 3.4 g/dL — ABNORMAL LOW (ref 3.5–5.0)
Alkaline Phosphatase: 63 U/L (ref 38–126)
Anion gap: 11 (ref 5–15)
BUN: 6 mg/dL (ref 6–20)
CO2: 21 mmol/L — ABNORMAL LOW (ref 22–32)
Calcium: 9 mg/dL (ref 8.9–10.3)
Chloride: 103 mmol/L (ref 98–111)
Creatinine, Ser: 0.73 mg/dL (ref 0.44–1.00)
GFR, Estimated: >60 mL/min (ref >60)
Glucose, Bld: 129 mg/dL — ABNORMAL HIGH (ref 70–99)
Potassium: 3.5 mmol/L (ref 3.5–5.1)
Sodium: 135 mmol/L (ref 135–145)
Total Bilirubin: 0.5 mg/dL (ref <1.2)
Total Protein: 7.8 g/dL (ref 6.5–8.1)

## 2023-01-08 LAB — ABO/RH: ABO/RH(D): O POS

## 2023-01-08 LAB — HCG, QUANTITATIVE, PREGNANCY: hCG, Beta Chain, Quant, S: 1476 m[IU]/mL — ABNORMAL HIGH (ref <5)

## 2023-01-08 LAB — POCT PREGNANCY, URINE: Preg Test, Ur: POSITIVE — AB

## 2023-01-08 NOTE — MAU Note (Signed)
.  Joy Duke is a 32 y.o. at Unknown here in MAU reporting: Left sided pelvic cramping that began around one week ago. She reports this past Friday she began experiencing vaginal bleeding with wiping and noted one blood clot. She reports both her pain and vaginal bleeding are coming and going.   LMP: 12/01/2022 Onset of complaint: One week Pain score: 7/10 pelvis  Vitals:   01/08/23 1821  BP: 133/68  Pulse: (!) 102  Resp: 18  Temp: 98.2 F (36.8 C)  SpO2: 99%      FHT: n/a Lab orders placed from triage: POCT Preg, UA

## 2023-01-08 NOTE — Discharge Instructions (Signed)
You were seen in the MAU for abdominal pain and light vaginal bleeding. We did blood tests and an ultrasound. You are pregnant and likely have a normal pregnancy, but we need to make sure it is not an ectopic given your history. To determine this you will need to have a repeat test of your pregnancy hormone level done in two days. We will schedule you an appointment to have your blood drawn at the [facility] at [facility]. Double check MyChart late tomorrow to make sure an appointment has been scheduled.   If you develop severe and progressively worsening abdominal pain, heavy vaginal bleeding soaking >1 pad/hour, or fever, return to the MAU or the nearest hospital immediately.

## 2023-01-08 NOTE — MAU Provider Note (Signed)
History     [id number]  Arrival date and time: 01/08/23 1608    Chief Complaint  Patient presents with   Pelvic Pain   Vaginal Bleeding     HPI Joy Duke is a 32 y.o. at [redacted]w[redacted]d by LMP with PMHx notable for ectopic pregnancy, who presents for left sided pelvic cramping and vaginal bleeding.   Patient history notable for ectopic pregnancy last year Has been having primarily left sided pelvic cramping for the past few days Has also had some light vaginal bleeding   --/--/O POS (12/16 2011)  OB History     Gravida  3   Para      Term      Preterm      AB  1   Living         SAB      IAB      Ectopic  1   Multiple      Live Births              Past Medical History:  Diagnosis Date   Anemia    ASCUS of cervix with negative high risk HPV 11/14/2017   Pap 10/2017, plan repeat cotesting 1 year    Ectopic pregnancy    Kidney stone    Mental disorder     Past Surgical History:  Procedure Laterality Date   KIDNEY STONE SURGERY      Family History  Problem Relation Age of Onset   Breast cancer Maternal Grandmother    Diabetes Maternal Grandmother    Hypertension Maternal Grandmother     Social History   Socioeconomic History   Marital status: Married    Spouse name: Not on file   Number of children: Not on file   Years of education: Not on file   Highest education level: Not on file  Occupational History   Occupation: Nanny  Tobacco Use   Smoking status: Never   Smokeless tobacco: Never  Vaping Use   Vaping status: Never Used  Substance and Sexual Activity   Alcohol use: Not Currently    Comment: occ   Drug use: Never   Sexual activity: Not on file  Other Topics Concern   Not on file  Social History Narrative   Not on file   Social Drivers of Health   Financial Resource Strain: Not on file  Food Insecurity: Not on file  Transportation Needs: Not on file  Physical Activity: Not on file  Stress: Not on file  Social  Connections: Unknown (02/27/2022)   Received from [facility], [facility]   Social Network    Social Network: Not on file  Intimate Partner Violence: Unknown (02/27/2022)   Received from [facility], [facility]   HITS    Physically Hurt: Not on file    Insult or Talk Down To: Not on file    Threaten Physical Harm: Not on file    Scream or Curse: Not on file    No Known Allergies  No current facility-administered medications on file prior to encounter.   Current Outpatient Medications on File Prior to Encounter  Medication Sig Dispense Refill   escitalopram (LEXAPRO) 20 MG tablet Take 20 mg by mouth daily.       ROS Pertinent positives and negative per HPI, all others reviewed and negative  Physical Exam   BP 133/68 (BP Location: Right Arm)   Pulse (!) 102   Temp 98.2 F (36.8 C) (Oral)   Resp  18   Ht 5\' 2"  (1.575 m)   Wt 96.8 kg   LMP 12/01/2022 (Exact Date)   SpO2 99%   BMI 39.05 kg/m   Patient Vitals for the past 24 hrs:  BP Temp Temp src Pulse Resp SpO2 Height Weight  01/08/23 1821 133/68 98.2 F (36.8 C) Oral (!) 102 18 99 % 5\' 2"  (1.575 m) 96.8 kg    Physical Exam Vitals reviewed.  Constitutional:      General: She is not in acute distress.    Appearance: She is well-developed. She is not diaphoretic.  Eyes:     General: No scleral icterus. Pulmonary:     Effort: Pulmonary effort is normal. No respiratory distress.  Skin:    General: Skin is warm and dry.  Neurological:     Mental Status: She is alert.     Coordination: Coordination normal.      Cervical Exam    Bedside Ultrasound Not performed.  My interpretation: n/a   Labs Results for orders placed or performed during the hospital encounter of 01/08/23 (from the past 24 hours)  Pregnancy, urine POC     Status: Abnormal   Collection Time: 01/08/23  5:11 PM  Result Value Ref Range   Preg Test, Ur POSITIVE (A) NEGATIVE  Urinalysis, Routine w reflex microscopic -Urine, Clean  Catch     Status: Abnormal   Collection Time: 01/08/23  6:25 PM  Result Value Ref Range   Color, Urine STRAW (A) YELLOW   APPearance CLEAR CLEAR   Specific Gravity, Urine 1.004 (L) 1.005 - 1.030   pH 6.0 5.0 - 8.0   Glucose, UA NEGATIVE NEGATIVE mg/dL   Hgb urine dipstick MODERATE (A) NEGATIVE   Bilirubin Urine NEGATIVE NEGATIVE   Ketones, ur NEGATIVE NEGATIVE mg/dL   Protein, ur NEGATIVE NEGATIVE mg/dL   Nitrite NEGATIVE NEGATIVE   Leukocytes,Ua NEGATIVE NEGATIVE   RBC / HPF 0-5 0 - 5 RBC/hpf   WBC, UA 0-5 0 - 5 WBC/hpf   Bacteria, UA RARE (A) NONE SEEN   Squamous Epithelial / HPF 0-5 0 - 5 /HPF   Mucus PRESENT   CBC     Status: Abnormal   Collection Time: 01/08/23  8:11 PM  Result Value Ref Range   WBC 14.7 (H) 4.0 - 10.5 K/uL   RBC 4.73 3.87 - 5.11 MIL/uL   Hemoglobin 10.8 (L) 12.0 - 15.0 g/dL   HCT 43.3 (L) 29.5 - 18.8 %   MCV 73.6 (L) 80.0 - 100.0 fL   MCH 22.8 (L) 26.0 - 34.0 pg   MCHC 31.0 30.0 - 36.0 g/dL   RDW 41.6 (H) 60.6 - 30.1 %   Platelets 441 (H) 150 - 400 K/uL   nRBC 0.0 0.0 - 0.2 %  ABO/Rh     Status: None   Collection Time: 01/08/23  8:11 PM  Result Value Ref Range   ABO/RH(D) O POS    No rh immune globuloin      NOT A RH IMMUNE GLOBULIN CANDIDATE, PT RH POSITIVE Performed at [facility], [address] [place], [place] [address]     Imaging No results found.  MAU Course  Procedures  Lab Orders         Urinalysis, Routine w reflex microscopic -Urine, Clean Catch         CBC         Comprehensive metabolic panel         hCG, quantitative, pregnancy  hCG, quantitative, pregnancy         Pregnancy, urine POC    No orders of the defined types were placed in this encounter.   Imaging Orders         US OB LESS THAN 14 WEEKS WITH OB TRANSVAGINAL     MDM Moderate (Level 3-4)  Assessment and Plan  #Abdominal pain in pregnancy, first trimester, Vaginal bleeding in pregnancy, first trimester #[redacted] weeks gestation of pregnancy US  shows IUGS. Likely IUP but still technically pregnancy of unknown location. Discussed with patient that the possibilities include ectopic pregnancy, normal pregnancy, or miscarriage. We discussed need to trend HCG to help determine the outcome of this pregnancy. Scheduled for repeat HCG at  [facility] office  in two days. Emphasized that ectopic pregnancy is a life threatening condition if not diagnosed and treated in a timely manner. Reviewed MAU return precautions of severe and progressively worsening abdominal pain, heavy vaginal bleeding soaking >1 pad/hour, or fever.  Dispo: discharged to home in stable condition   Venora Maples, MD/MPH 01/08/23 9:08 PM  Allergies as of 01/08/2023   No Known Allergies      Medication List     TAKE these medications    escitalopram 20 MG tablet Commonly known as: LEXAPRO Take 20 mg by mouth daily.

## 2023-01-10 ENCOUNTER — Other Ambulatory Visit: Payer: BC Managed Care – PPO

## 2023-01-10 DIAGNOSIS — O209 Hemorrhage in early pregnancy, unspecified: Secondary | ICD-10-CM

## 2023-01-11 ENCOUNTER — Telehealth: Payer: Self-pay

## 2023-01-11 LAB — BETA HCG QUANT (REF LAB): hCG Quant: 1778 m[IU]/mL

## 2023-01-11 NOTE — Telephone Encounter (Addendum)
-----   Message from Venora Maples sent at 01/11/2023 12:44 PM EST ----- Suboptimal rise of 20%, unclear if this is ectopic, nonviable pregnancy, or lab variation. Recommend repeat hcg in 48 hours  Pt notified of results and agreed to come in on 01/12/23 at 0815 per Dr. Crissie Reese.  Phylisha Dix,RN  01/11/23

## 2023-01-12 ENCOUNTER — Ambulatory Visit: Payer: BC Managed Care – PPO

## 2023-01-12 ENCOUNTER — Telehealth: Payer: Self-pay | Admitting: Family Medicine

## 2023-01-12 ENCOUNTER — Ambulatory Visit (INDEPENDENT_AMBULATORY_CARE_PROVIDER_SITE_OTHER): Payer: BC Managed Care – PPO

## 2023-01-12 DIAGNOSIS — Z3A01 Less than 8 weeks gestation of pregnancy: Secondary | ICD-10-CM

## 2023-01-12 DIAGNOSIS — O3680X0 Pregnancy with inconclusive fetal viability, not applicable or unspecified: Secondary | ICD-10-CM

## 2023-01-12 NOTE — Telephone Encounter (Signed)
Canceled this patients appointment because she is being seen at the [facility] office for a nurses visit this morning at 8:30 am.

## 2023-01-12 NOTE — Progress Notes (Signed)
Patient presents for repeat HCG. Patient was sent to the lab to have labs drawn. Khyleigh Furney l Keeanna Villafranca, CMA

## 2023-01-13 ENCOUNTER — Encounter: Payer: Self-pay | Admitting: Family Medicine

## 2023-01-13 LAB — BETA HCG QUANT (REF LAB): hCG Quant: 3246 m[IU]/mL

## 2023-01-15 ENCOUNTER — Other Ambulatory Visit: Payer: Self-pay

## 2023-01-15 DIAGNOSIS — Z3490 Encounter for supervision of normal pregnancy, unspecified, unspecified trimester: Secondary | ICD-10-CM

## 2023-01-22 ENCOUNTER — Other Ambulatory Visit: Payer: Self-pay | Admitting: Family Medicine

## 2023-01-22 ENCOUNTER — Ambulatory Visit (HOSPITAL_BASED_OUTPATIENT_CLINIC_OR_DEPARTMENT_OTHER)
Admission: RE | Admit: 2023-01-22 | Discharge: 2023-01-22 | Disposition: A | Discharge status: Home / Self Care | Payer: BC Managed Care – PPO | Source: Ambulatory Visit | Attending: Family Medicine | Admitting: Family Medicine

## 2023-01-22 DIAGNOSIS — Z3490 Encounter for supervision of normal pregnancy, unspecified, unspecified trimester: Secondary | ICD-10-CM

## 2023-01-22 DIAGNOSIS — Z3491 Encounter for supervision of normal pregnancy, unspecified, first trimester: Secondary | ICD-10-CM | POA: Insufficient documentation

## 2023-01-22 DIAGNOSIS — Z3A01 Less than 8 weeks gestation of pregnancy: Secondary | ICD-10-CM | POA: Insufficient documentation

## 2023-01-25 ENCOUNTER — Emergency Department (HOSPITAL_BASED_OUTPATIENT_CLINIC_OR_DEPARTMENT_OTHER)
Admission: EM | Admit: 2023-01-25 | Discharge: 2023-01-25 | Disposition: A | Discharge status: Home / Self Care | Payer: 59 | Attending: Emergency Medicine | Admitting: Emergency Medicine

## 2023-01-25 ENCOUNTER — Other Ambulatory Visit: Payer: Self-pay

## 2023-01-25 ENCOUNTER — Encounter (HOSPITAL_BASED_OUTPATIENT_CLINIC_OR_DEPARTMENT_OTHER): Payer: Self-pay | Admitting: Emergency Medicine

## 2023-01-25 DIAGNOSIS — R1013 Epigastric pain: Secondary | ICD-10-CM | POA: Diagnosis present

## 2023-01-25 LAB — COMPREHENSIVE METABOLIC PANEL
ALT: 18 U/L (ref 0–44)
AST: 17 U/L (ref 15–41)
Albumin: 3.4 g/dL — ABNORMAL LOW (ref 3.5–5.0)
Alkaline Phosphatase: 64 U/L (ref 38–126)
Anion gap: 10 (ref 5–15)
BUN: 8 mg/dL (ref 6–20)
CO2: 19 mmol/L — ABNORMAL LOW (ref 22–32)
Calcium: 9.4 mg/dL (ref 8.9–10.3)
Chloride: 102 mmol/L (ref 98–111)
Creatinine, Ser: 0.71 mg/dL (ref 0.44–1.00)
GFR, Estimated: >60 mL/min (ref >60)
Glucose, Bld: 136 mg/dL — ABNORMAL HIGH (ref 70–99)
Potassium: 3.8 mmol/L (ref 3.5–5.1)
Sodium: 131 mmol/L — ABNORMAL LOW (ref 135–145)
Total Bilirubin: <0.2 mg/dL (ref 0.0–1.2)
Total Protein: 8.3 g/dL — ABNORMAL HIGH (ref 6.5–8.1)

## 2023-01-25 LAB — CBC WITH DIFFERENTIAL/PLATELET
Abs Immature Granulocytes: 0.02 10*3/uL (ref 0.00–0.07)
Basophils Absolute: 0.1 10*3/uL (ref 0.0–0.1)
Basophils Relative: 1 %
Eosinophils Absolute: 0.1 10*3/uL (ref 0.0–0.5)
Eosinophils Relative: 1 %
HCT: 36.4 % (ref 36.0–46.0)
Hemoglobin: 11.4 g/dL — ABNORMAL LOW (ref 12.0–15.0)
Immature Granulocytes: 0 %
Lymphocytes Relative: 22 %
Lymphs Abs: 2 10*3/uL (ref 0.7–4.0)
MCH: 22.6 pg — ABNORMAL LOW (ref 26.0–34.0)
MCHC: 31.3 g/dL (ref 30.0–36.0)
MCV: 72.2 fL — ABNORMAL LOW (ref 80.0–100.0)
Monocytes Absolute: 0.7 10*3/uL (ref 0.1–1.0)
Monocytes Relative: 7 %
Neutro Abs: 6.3 10*3/uL (ref 1.7–7.7)
Neutrophils Relative %: 69 %
Platelets: 449 10*3/uL — ABNORMAL HIGH (ref 150–400)
RBC: 5.04 MIL/uL (ref 3.87–5.11)
RDW: 18.3 % — ABNORMAL HIGH (ref 11.5–15.5)
WBC: 9.2 10*3/uL (ref 4.0–10.5)
nRBC: 0 % (ref 0.0–0.2)

## 2023-01-25 LAB — LIPASE, BLOOD: Lipase: 24 U/L (ref 11–51)

## 2023-01-25 MED ORDER — CALCIUM CARBONATE ANTACID 500 MG PO CHEW
1.0000 | CHEWABLE_TABLET | Freq: Once | ORAL | Status: AC
  Administered 2023-01-25: 200 mg via ORAL
  Filled 2023-01-25: qty 1

## 2023-01-25 MED ORDER — FAMOTIDINE 20 MG PO TABS
20.0000 mg | ORAL_TABLET | Freq: Once | ORAL | Status: AC
  Administered 2023-01-25: 20 mg via ORAL
  Filled 2023-01-25: qty 1

## 2023-01-25 MED ORDER — METOCLOPRAMIDE HCL 5 MG/ML IJ SOLN
10.0000 mg | Freq: Once | INTRAMUSCULAR | Status: AC
  Administered 2023-01-25: 10 mg via INTRAVENOUS
  Filled 2023-01-25: qty 2

## 2023-01-25 NOTE — ED Triage Notes (Signed)
 Epi gastric and back pain since 0000, woke pt up. Tried tylenol, heating pad and OTC meds at home with no relief.

## 2023-01-25 NOTE — ED Provider Notes (Signed)
 [facility] Provider Note  CSN: [id number] Arrival date & time: 01/25/23 0350  Chief Complaint(s) Abdominal Pain  HPI Joy Duke is a 33 y.o. female currently ~[redacted]wk pregnant with confirmed IUP by US  on 12/30    Abdominal Pain Pain location:  Epigastric Pain quality: aching   Pain radiates to:  Back Pain severity:  Moderate Onset quality: woke her up from sleep. Timing:  Constant Progression:  Unchanged Chronicity:  Recurrent Relieved by: sitting up. Exacerbated by: being supine. Ineffective treatments:  Acetaminophen and heat Associated symptoms: no chest pain, no chills, no cough, no diarrhea, no dysuria, no fatigue, no fever, no nausea, no shortness of breath and no vomiting    Denies lower abd pain   Past Medical History Past Medical History:  Diagnosis Date   Anemia    ASCUS of cervix with negative high risk HPV 11/14/2017   Pap 10/2017, plan repeat cotesting 1 year    Ectopic pregnancy    Kidney stone    Mental disorder    Patient Active Problem List   Diagnosis Date Noted   Left tubal pregnancy without intrauterine pregnancy 01/11/2022   Back pain 09/24/2020   Neck pain 09/24/2020   Symptomatic mammary hypertrophy 09/24/2020   ASCUS of cervix with negative high risk HPV 11/14/2017   Palpitations 08/03/2017   Rash and nonspecific skin eruption 08/03/2017   Microcytic anemia 08/03/2017   Anxiety 08/03/2017   Prediabetes 08/03/2017   Home Medication(s) Prior to Admission medications   Medication Sig Start Date End Date Taking? Authorizing Provider  escitalopram (LEXAPRO) 20 MG tablet Take 20 mg by mouth daily. 08/15/18   [provider]                                                                                                                                    Allergies Patient has no known allergies.  Review of Systems Review of Systems  Constitutional:  Negative for chills, fatigue and  fever.  Respiratory:  Negative for cough and shortness of breath.   Cardiovascular:  Negative for chest pain.  Gastrointestinal:  Positive for abdominal pain. Negative for diarrhea, nausea and vomiting.  Genitourinary:  Negative for dysuria.   As noted in HPI  Physical Exam Vital Signs  I have reviewed the triage vital signs BP 136/74 (BP Location: Right Arm)   Pulse 92   Temp 98.5 F (36.9 C) (Oral)   Resp 16   LMP 12/01/2022 (Exact Date)   SpO2 100%   Physical Exam Vitals reviewed.  Constitutional:      General: She is not in acute distress.    Appearance: She is well-developed. She is not diaphoretic.  HENT:     Head: Normocephalic and atraumatic.     Right Ear: External ear normal.     Left Ear: External ear normal.     Nose: Nose normal.  Eyes:  General: No scleral icterus.    Conjunctiva/sclera: Conjunctivae normal.  Neck:     Trachea: Phonation normal.  Cardiovascular:     Rate and Rhythm: Normal rate and regular rhythm.  Pulmonary:     Effort: Pulmonary effort is normal. No respiratory distress.     Breath sounds: No stridor.  Abdominal:     General: There is no distension.     Tenderness: There is no abdominal tenderness. There is no guarding or rebound.  Musculoskeletal:        General: Normal range of motion.     Cervical back: Normal range of motion.  Neurological:     Mental Status: She is alert and oriented to person, place, and time.  Psychiatric:        Behavior: Behavior normal.     ED Results and Treatments Labs (all labs ordered are listed, but only abnormal results are displayed) Labs Reviewed  CBC WITH DIFFERENTIAL/PLATELET - Abnormal; Notable for the following components:      Result Value   Hemoglobin 11.4 (*)    MCV 72.2 (*)    MCH 22.6 (*)    RDW 18.3 (*)    Platelets 449 (*)    All other components within normal limits  COMPREHENSIVE METABOLIC PANEL - Abnormal; Notable for the following components:   Sodium 131 (*)    CO2  19 (*)    Glucose, Bld 136 (*)    Total Protein 8.3 (*)    Albumin 3.4 (*)    All other components within normal limits  LIPASE, BLOOD                                                                                                                         EKG  EKG Interpretation Date/Time:    Ventricular Rate:    PR Interval:    QRS Duration:    QT Interval:    QTC Calculation:   R Axis:      Text Interpretation:         Radiology No results found.  Medications Ordered in ED Medications  metoCLOPramide  (REGLAN ) injection 10 mg (10 mg Intravenous Given 01/25/23 0425)  calcium  carbonate (TUMS - dosed in mg elemental calcium ) chewable tablet 200 mg of elemental calcium  (200 mg of elemental calcium  Oral Given 01/25/23 0414)  famotidine  (PEPCID ) tablet 20 mg (20 mg Oral Given 01/25/23 0414)   Procedures Procedures  (including critical care time) Medical Decision Making / ED Course   Medical Decision Making Amount and/or Complexity of Data Reviewed Labs: ordered.  Risk OTC drugs. Prescription drug management.    Epigastric abd pain ddx considered: GERD/gastritis, pancreatitis, biliary disease, early gastroenteritis. Less likely cardiac etiology. No lower abd pain concerning for pregnancy related process.  Provided with Tums and Pepcid .  Labs reassuring w/o evidence of biliary obstruction or pancreatitis. Doubt serious intra-abdominal inflammatory/infectious process.  Pain completely resolved with above tx.    Final Clinical Impression(s) / ED Diagnoses Final diagnoses:  Epigastric pain   The patient appears reasonably screened and/or stabilized for discharge and I doubt any other medical condition or other [facility] requiring further screening, evaluation, or treatment in the ED at this time. I have discussed the findings, Dx and Tx plan with the patient/family who expressed understanding and agree(s) with the plan. Discharge instructions discussed at length. The  patient/family was given strict return precautions who verbalized understanding of the instructions. No further questions at time of discharge.  Disposition: Discharge  Condition: Good  ED Discharge Orders     None       Follow Up: Rutherford Gain, MD [address] [place] [address] [phone]  Call  to schedule an appointment for close follow up    This chart was dictated using voice recognition software.  Despite best efforts to proofread,  errors can occur which can change the documentation meaning.    Trine Raynell Moder, MD 01/25/23 [phone]

## 2023-01-26 ENCOUNTER — Ambulatory Visit: Payer: 59 | Admitting: Family Medicine

## 2023-04-25 ENCOUNTER — Encounter: Payer: Self-pay | Admitting: Family Medicine

## 2023-04-25 ENCOUNTER — Inpatient Hospital Stay (HOSPITAL_COMMUNITY)
Admission: AD | Admit: 2023-04-25 | Discharge: 2023-04-25 | Disposition: A | Discharge status: Home / Self Care | Attending: Obstetrics and Gynecology | Admitting: Obstetrics and Gynecology

## 2023-04-25 ENCOUNTER — Encounter (HOSPITAL_COMMUNITY): Payer: Self-pay | Admitting: Obstetrics and Gynecology

## 2023-04-25 ENCOUNTER — Other Ambulatory Visit: Payer: Self-pay

## 2023-04-25 DIAGNOSIS — Z3A20 20 weeks gestation of pregnancy: Secondary | ICD-10-CM | POA: Insufficient documentation

## 2023-04-25 DIAGNOSIS — N898 Other specified noninflammatory disorders of vagina: Secondary | ICD-10-CM | POA: Diagnosis present

## 2023-04-25 DIAGNOSIS — O26892 Other specified pregnancy related conditions, second trimester: Secondary | ICD-10-CM | POA: Insufficient documentation

## 2023-04-25 LAB — WET PREP, GENITAL
Clue Cells Wet Prep HPF POC: NONE SEEN
Sperm: NONE SEEN
Trich, Wet Prep: NONE SEEN
WBC, Wet Prep HPF POC: <10 (ref <10)
Yeast Wet Prep HPF POC: NONE SEEN

## 2023-04-25 LAB — URINALYSIS, ROUTINE W REFLEX MICROSCOPIC
Bilirubin Urine: NEGATIVE
Glucose, UA: NEGATIVE mg/dL
Hgb urine dipstick: NEGATIVE
Ketones, ur: NEGATIVE mg/dL
Leukocytes,Ua: NEGATIVE
Nitrite: NEGATIVE
Protein, ur: NEGATIVE mg/dL
Specific Gravity, Urine: 1.004 — ABNORMAL LOW (ref 1.005–1.030)
pH: 7 (ref 5.0–8.0)

## 2023-04-25 NOTE — Discharge Instructions (Addendum)
 All of your tests came back negative today, which is very reassuring! You are not having any symptoms that indicate that you or your baby are in danger.  Vaginal discharge tends to increase during pregnancy. Signs that the discharge is abnormal and you should come back to the Maternal Assessment Unit for evaluation would be: Change in the volume, color, or odor of vaginal discharge Vulvar or vaginal itching Burning Irritation Redness Pain during sex Spotting Pain with urination  Round Ligament Pain During Pregnancy Round ligament pain is a sharp pain or jabbing feeling often felt in the lower belly or groin area on one or both sides. It is one of the most common complaints during pregnancy and is considered a normal part of pregnancy. It is most often felt during the second trimester.  Here is what you need to know about round ligament pain, including some tips to help you feel better.  Causes of Round Ligament Pain  Several thick ligaments surround and support your womb (uterus) as it grows during pregnancy. One of them is called the round ligament.  The round ligament connects the front part of the womb to your groin, the area where your legs attach to your pelvis. The round ligament normally tightens and relaxes slowly.  As your baby and womb grow, the round ligament stretches. That makes it more likely to become strained.  Sudden movements can cause the ligament to tighten quickly, like a rubber band snapping. This causes a sudden and quick jabbing feeling.  Symptoms of Round Ligament Pain  Round ligament pain can be concerning and uncomfortable. But it is considered normal as your body changes during pregnancy.  The symptoms of round ligament pain include a sharp, sudden spasm in the belly. It usually affects the right side, but it may happen on both sides. The pain only lasts a few seconds.  Exercise may cause the pain, as will rapid movements such  as:  sneezing coughing laughing rolling over in bed standing up too quickly  Comfort Measures:  Soak in a warm tub Tylenol 1000 mg by mouth every 6-8 hrs as needed for pain Slow position changes Laying on the affected side    Massage: Starting at the middle of your pubic bone, trace little circles in a wide U from your pubic bone to your hip bones on both sides.  Then starting just above your pubic bone, press in and down, alternating sides to create a gentle rocking of your uterus back and forth.  Move your hands up the sides of your belly and back down. Do this 3-5 times upon waking and before bed.   Stretches: Get on hands and knees and alternate arching your back deeply while inhaling, and then rounding your back while exhaling. Modified runners lunge:  - Sit on a chair with half of your bottom on the chair and half off.  - Sit up tall, plant your front foot, and stretch your other foot out behind you.  - Breathe deeply for 5 breaths and then do the other side.   Also chiropractors and massages are safe in pregnancy. [facility] in [place] specializes in pregnancy care. You can visit their website at: [email or web address] to schedule a new patient chiropractic treatment (1 hour) appointment. Please let us know if you have any other questions or concerns.  Maternity Support [employer] can purchase on [place] or through [place]. Below is an example.

## 2023-04-25 NOTE — MAU Note (Signed)
 Joy Duke is a 33 y.o. at [redacted]w[redacted]d here in MAU reporting: she's began having a pink colored discharge with wiping that began today.  States had brown colored discharged over the weekend.  Denies vaginal itching/irritation and odor.  Also reports lower abdominal discomfort that is sharp at times.  Denies taking meds to treat.  Denies recent intercourse  LMP: 12/01/2022 Onset of complaint: today Pain score: 4 Vitals:   04/25/23 0817  BP: (!) 146/77  Pulse: (!) 103  Resp: 18  Temp: 98.6 F (37 C)  SpO2: 99%     FHT: 155 bpm  Lab orders placed from triage: UA

## 2023-04-25 NOTE — MAU Provider Note (Signed)
 Chief Complaint:  Vaginal Discharge   HPI   None     Joy Duke is a 33 y.o. G3P0010 at [redacted]w[redacted]d who presents to maternity admissions reporting vaginal discharge with wiping.  She states that she had brown discharge over the weekend (5 days ago), today it changed to a pink color. Endorses abdominal pain located in the RLQ described as sharp without radiation, denies vaginal symptoms of burning, itching, and perineal odor and vaginal discharge copious, white, green, frothy, and curd-like. Patient denies associated symptoms: abnormal bleeding, burning, discharge, odor, pain, urinary symptoms of chills, dysuria, fever, nausea, and vomiting, and vulvar itching. She has known history of ectopic pregnancy which makes her and her partner very cautious. Alleviating factors: none, nothing tried at home. Denies recent intercourse.   Pregnancy Course: Receives care with Dr. Shawnie Pons in [facility]. Prenatal records not available for review but have been requested. Patient denies complications during this pregnancy, anatomy scan was about 1 week ago with next prenatal appointment in ~3 weeks at [redacted] weeks gestation. She notes that she has an anterior placenta, provided thorough history of prenatal care.  Past Medical History:  Diagnosis Date   Anemia    ASCUS of cervix with negative high risk HPV 11/14/2017   Pap 10/2017, plan repeat cotesting 1 year    Ectopic pregnancy    Kidney stone    Mental disorder    OB History  Gravida Para Term Preterm AB Living  3    1   SAB IAB Ectopic Multiple Live Births    1      # Outcome Date GA Lbr Len/2nd Weight Sex Type Anes PTL Lv  3 Current           2 Ectopic 12/2021     ECTOPIC     1 Gravida            Past Surgical History:  Procedure Laterality Date   KIDNEY STONE SURGERY     Family History  Problem Relation Age of Onset   Breast cancer Maternal Grandmother    Diabetes Maternal Grandmother    Hypertension Maternal Grandmother    Social History    Tobacco Use   Smoking status: Never   Smokeless tobacco: Never  Vaping Use   Vaping status: Never Used  Substance Use Topics   Alcohol use: Not Currently    Comment: occ   Drug use: Never   No Known Allergies Medications Prior to Admission  Medication Sig Dispense Refill Last Dose/Taking   escitalopram (LEXAPRO) 20 MG tablet Take 20 mg by mouth daily.       I have reviewed patient's Past Medical Hx, Surgical Hx, Family Hx, Social Hx, medications and allergies.   ROS  Pertinent items noted in HPI and remainder of comprehensive ROS otherwise negative.   PHYSICAL EXAM  Patient Vitals for the past 24 hrs:  BP Temp Temp src Pulse Resp SpO2 Height Weight  04/25/23 0817 (!) 146/77 98.6 F (37 C) Oral (!) 103 18 99 % -- --  04/25/23 0812 -- -- -- -- -- -- 5\' 2"  (1.575 m) 93.1 kg    Constitutional: Well-developed, well-nourished female in no acute distress.  Cardiovascular: warm and well-perfused Respiratory: normal effort, no problems with respiration noted GI: Abd soft, non-tender, non-distended MSK: Extremities nontender, no edema, normal ROM Skin: warm and dry. Acyanotic, no jaundice or pallor. Neurologic: Alert and oriented x 4. No abnormal coordination. Psychiatric: Normal mood. Speech not slurred, not rapid/pressured. Patient is  cooperative.  Labs: Results for orders placed or performed during the hospital encounter of 04/25/23 (from the past 24 hours)  Urinalysis, Routine w reflex microscopic -Urine, Clean Catch     Status: Abnormal   Collection Time: 04/25/23  8:35 AM  Result Value Ref Range   Color, Urine STRAW (A) YELLOW   APPearance CLEAR CLEAR   Specific Gravity, Urine 1.004 (L) 1.005 - 1.030   pH 7.0 5.0 - 8.0   Glucose, UA NEGATIVE NEGATIVE mg/dL   Hgb urine dipstick NEGATIVE NEGATIVE   Bilirubin Urine NEGATIVE NEGATIVE   Ketones, ur NEGATIVE NEGATIVE mg/dL   Protein, ur NEGATIVE NEGATIVE mg/dL   Nitrite NEGATIVE NEGATIVE   Leukocytes,Ua NEGATIVE  NEGATIVE  Wet prep, genital     Status: None   Collection Time: 04/25/23  8:35 AM   Specimen: PATH Cytology Cervicovaginal Ancillary Only  Result Value Ref Range   Yeast Wet Prep HPF POC NONE SEEN NONE SEEN   Trich, Wet Prep NONE SEEN NONE SEEN   Clue Cells Wet Prep HPF POC NONE SEEN NONE SEEN   WBC, Wet Prep HPF POC <10 <10   Sperm NONE SEEN     Imaging:  No results found.  MDM & MAU COURSE  MDM: Moderate  MAU Course: -Rule out acute cystitis, vaginitis.  -Discussed that presentation is reassuring for fetal well-being. Reassured that ultrasound is not indicated at this time. -Wet prep and UA negative, discharge likely normal physiologic vaginal discharge. -Abdominal pain likely round ligament pain.  Differential diagnosis considered for vaginal discharge includes but is not limited to: bacterial vaginosis, vulvovaginal candidiasis, trichomoniasis, gonorrhea, chlamydia Differential diagnosis considered for abdominal pain includes but is not limited to: round ligament pain, acute cystitis, vaginitis  Orders Placed This Encounter  Procedures   Wet prep, genital   Urinalysis, Routine w reflex microscopic -Urine, Clean Catch   Discharge patient   No orders of the defined types were placed in this encounter.   ASSESSMENT   1. Vaginal discharge during pregnancy in second trimester   2. [redacted] weeks gestation of pregnancy     PLAN  Discharge home in stable condition with return precautions: abnormal vaginal discharge, vaginal bleeding, leaking of fluids, contractions, severe abdominal pain, fever/chills.  Provided additional information on normal vs. abnormal vaginal discharge and round ligament pain.  Recommend stretching and/or Tylenol for round ligament pain.   Follow-up Information     Alm Bustard, MD Follow up.   Specialty: Obstetrics and Gynecology Why: As scheduled for ongoing prenatal care Contact information: [address] [place]  [address] [phone]                 Allergies as of 04/25/2023   No Known Allergies      Medication List     TAKE these medications    escitalopram 20 MG tablet Commonly known as: LEXAPRO Take 20 mg by mouth daily.        Melida Quitter, PA

## 2023-04-26 LAB — GC/CHLAMYDIA PROBE AMP (~~LOC~~) NOT AT ARMC
Chlamydia: NEGATIVE
Comment: NEGATIVE
Comment: NORMAL
Neisseria Gonorrhea: NEGATIVE
